# Patient Record
Sex: Female | Born: 1950 | Race: Black or African American | Hispanic: No | Marital: Single | Smoking: Former smoker
Health system: Southern US, Community
[De-identification: ages and names within clinical notes are randomized; demographics above are authoritative.]

## PROBLEM LIST (undated history)

## (undated) DIAGNOSIS — D3502 Benign neoplasm of left adrenal gland: Secondary | ICD-10-CM

## (undated) DIAGNOSIS — F32A Depression, unspecified: Secondary | ICD-10-CM

## (undated) DIAGNOSIS — F329 Major depressive disorder, single episode, unspecified: Secondary | ICD-10-CM

## (undated) DIAGNOSIS — K219 Gastro-esophageal reflux disease without esophagitis: Secondary | ICD-10-CM

## (undated) DIAGNOSIS — M1990 Unspecified osteoarthritis, unspecified site: Secondary | ICD-10-CM

## (undated) DIAGNOSIS — I10 Essential (primary) hypertension: Secondary | ICD-10-CM

## (undated) HISTORY — DX: Unspecified osteoarthritis, unspecified site: M19.90

## (undated) HISTORY — DX: Essential (primary) hypertension: I10

---

## 2010-12-23 ENCOUNTER — Encounter: Payer: Self-pay | Admitting: *Deleted

## 2010-12-23 ENCOUNTER — Emergency Department (INDEPENDENT_AMBULATORY_CARE_PROVIDER_SITE_OTHER): Payer: Self-pay

## 2010-12-23 ENCOUNTER — Emergency Department (HOSPITAL_BASED_OUTPATIENT_CLINIC_OR_DEPARTMENT_OTHER)
Admission: EM | Admit: 2010-12-23 | Discharge: 2010-12-23 | Disposition: A | Discharge status: Home / Self Care | Payer: Self-pay | Attending: Emergency Medicine | Admitting: Emergency Medicine

## 2010-12-23 DIAGNOSIS — M25559 Pain in unspecified hip: Secondary | ICD-10-CM | POA: Insufficient documentation

## 2010-12-23 DIAGNOSIS — Z79899 Other long term (current) drug therapy: Secondary | ICD-10-CM | POA: Insufficient documentation

## 2010-12-23 DIAGNOSIS — F3289 Other specified depressive episodes: Secondary | ICD-10-CM | POA: Insufficient documentation

## 2010-12-23 DIAGNOSIS — F329 Major depressive disorder, single episode, unspecified: Secondary | ICD-10-CM | POA: Insufficient documentation

## 2010-12-23 DIAGNOSIS — K219 Gastro-esophageal reflux disease without esophagitis: Secondary | ICD-10-CM | POA: Insufficient documentation

## 2010-12-23 DIAGNOSIS — M25569 Pain in unspecified knee: Secondary | ICD-10-CM

## 2010-12-23 DIAGNOSIS — I998 Other disorder of circulatory system: Secondary | ICD-10-CM

## 2010-12-23 DIAGNOSIS — M8530 Osteitis condensans, unspecified site: Secondary | ICD-10-CM

## 2010-12-23 HISTORY — DX: Major depressive disorder, single episode, unspecified: F32.9

## 2010-12-23 HISTORY — DX: Depression, unspecified: F32.A

## 2010-12-23 HISTORY — DX: Gastro-esophageal reflux disease without esophagitis: K21.9

## 2010-12-23 MED ORDER — HYDROCODONE-ACETAMINOPHEN 5-325 MG PO TABS: 2.0000 | ORAL_TABLET | ORAL | Status: DC | PRN

## 2010-12-23 MED ORDER — IBUPROFEN 800 MG PO TABS: 800.0000 mg | ORAL_TABLET | Freq: Three times a day (TID) | ORAL | Status: AC

## 2010-12-23 MED ORDER — IBUPROFEN 600 MG PO TABS: 600.0000 mg | ORAL_TABLET | Freq: Four times a day (QID) | ORAL | Status: DC | PRN

## 2010-12-23 NOTE — ED Notes (Signed)
Pt c/o right leg pain and numbness x3 weeks. Pt was seen at [facility] ED apprx. 1.5 weeks ago. Pt has also f/u with her PMD but leg is not improving.

## 2010-12-23 NOTE — ED Provider Notes (Signed)
History     CSN: [id number] Arrival date & time: 12/23/2010  5:46 PM  Chief Complaint  Patient presents with  . Leg Pain    (Consider location/radiation/quality/duration/timing/severity/associated sxs/prior treatment) Patient is a 60 y.o. female presenting with hip pain. The history is provided by the patient. No language interpreter was used.  Hip Pain This is a new problem. The current episode started 1 to 4 weeks ago. The problem occurs constantly. The problem has been rapidly worsening. Associated symptoms include joint swelling and myalgias. The symptoms are aggravated by nothing. She has tried nothing for the symptoms. The treatment provided moderate relief.  Pt complains of pain to right hip,  Pt reports she has had for several weeks.  Pt was seen in Ed and given a shot and steroids.  Pt reports she [facility] and was told that it would take time to feel better.  Past Medical History  Diagnosis Date  . GERD (gastroesophageal reflux disease)   . Depression     History reviewed. No pertinent past surgical history.  No family history on file.  History  Substance Use Topics  . Smoking status: Former Games developer  . Smokeless tobacco: Not on file  . Alcohol Use: No    OB History    Grav Para Term Preterm Abortions TAB SAB Ect Mult Living                  Review of Systems  Musculoskeletal: Positive for myalgias, joint swelling and gait problem.  All other systems reviewed and are negative.    Allergies  Aspirin and Eggs or egg-derived products  Home Medications   Current Outpatient Rx  Name Route Sig Dispense Refill  . ATENOLOL 50 MG PO TABS Oral Take 50 mg by mouth 2 (two) times daily.      Marland Kitchen ACID RELIEF PO Oral Take 1 tablet by mouth daily.      Marland Kitchen GABAPENTIN PO Oral Take 1 capsule by mouth 2 (two) times daily.      Marland Kitchen HYDROCHLOROTHIAZIDE 25 MG PO TABS Oral Take 12.5 mg by mouth daily.      Marland Kitchen MEDROXYPROGESTERONE ACETATE 2.5 MG PO TABS Oral Take 2.5  mg by mouth daily.      Marland Kitchen METHOCARBAMOL PO Oral Take 1 tablet by mouth 3 (three) times daily.      . ROSEMARY OIL OIL Oral Take 1 tablet by mouth 3 (three) times daily as needed. For sleep     . ZOLOFT PO Oral Take 1 tablet by mouth daily.        BP 154/87  Pulse 64  Temp(Src) 98.2 F (36.8 C) (Oral)  Resp 18  SpO2 98%  Physical Exam  Nursing note and vitals reviewed. Constitutional: She is oriented to person, place, and time. She appears well-developed and well-nourished.  HENT:  Head: Normocephalic and atraumatic.  Eyes: Conjunctivae and EOM are normal. Pupils are equal, round, and reactive to light.  Neck: Normal range of motion. Neck supple.  Cardiovascular: Normal rate, regular rhythm and normal heart sounds.   Pulmonary/Chest: Effort normal and breath sounds normal.  Abdominal: Soft.  Musculoskeletal: She exhibits tenderness. She exhibits no edema.       Pain with range of motion,  Hip and knee,  From,  nv and ns intact  Neurological: She is alert and oriented to person, place, and time. She has normal reflexes.  Skin: Skin is warm.  Psychiatric: She has a normal mood and  affect.    ED Course  Procedures (including critical care time)  Labs Reviewed - No data to display Dg Hip Complete Right  12/23/2010  *RADIOLOGY REPORT*  Clinical Data: Hip pain for 3 weeks  RIGHT HIP - COMPLETE 2+ VIEW  Comparison: None  Findings: Minimal osteitis pubis. Symmetric preserved hip and SI joints. No acute fracture, dislocation, or bone destruction. Small acetabular spur questioned. Right pelvic phlebolith.  IMPRESSION: Minimal osteitis pubis. No acute right hip joint abnormalities. Questionable right acetabular spur  Original Report Authenticated By: Lollie Marrow, M.D.   Dg Knee 1-2 Views Right  12/23/2010  *RADIOLOGY REPORT*  Clinical Data: Right knee pain for 3 weeks  RIGHT KNEE - 1-2 VIEW  Comparison: None  Findings: Small patellar spur quadriceps tendon insertion. Osseous  mineralization grossly normal. Joint spaces preserved. No acute fracture, dislocation or bone destruction. No knee joint effusion.  IMPRESSION: No acute bony abnormalities.  Original Report Authenticated By: Lollie Marrow, M.D.     No diagnosis found.    MDM  Xray shows spurs,  I will refer to Dr. Pearletha Forge.  Pt given Rx for ibuprofen and hydrocodone.          Langston Masker, [place] 12/23/10 1929

## 2010-12-25 NOTE — ED Provider Notes (Signed)
Medical screening examination/treatment/procedure(s) were performed by non-physician practitioner and as supervising physician I was immediately available for consultation/collaboration.  Laniqua Torrens, MD 12/25/10 1928 

## 2010-12-30 ENCOUNTER — Other Ambulatory Visit: Payer: Self-pay | Admitting: Family Medicine

## 2010-12-30 ENCOUNTER — Encounter: Payer: Self-pay | Admitting: Family Medicine

## 2010-12-30 ENCOUNTER — Ambulatory Visit (INDEPENDENT_AMBULATORY_CARE_PROVIDER_SITE_OTHER): Payer: Self-pay | Admitting: Family Medicine

## 2010-12-30 VITALS — BP 126/73 | HR 61 | Temp 98.5°F | Ht 63.0 in | Wt 163.0 lb

## 2010-12-30 DIAGNOSIS — M25551 Pain in right hip: Secondary | ICD-10-CM | POA: Insufficient documentation

## 2010-12-30 DIAGNOSIS — M25559 Pain in unspecified hip: Secondary | ICD-10-CM

## 2010-12-30 DIAGNOSIS — M25569 Pain in unspecified knee: Secondary | ICD-10-CM

## 2010-12-30 DIAGNOSIS — M25561 Pain in right knee: Secondary | ICD-10-CM | POA: Insufficient documentation

## 2010-12-30 MED ORDER — OXYCODONE-ACETAMINOPHEN 5-325 MG PO TABS: 1.0000 | ORAL_TABLET | Freq: Four times a day (QID) | ORAL | Status: AC | PRN

## 2010-12-30 NOTE — Assessment & Plan Note (Signed)
patient has signs and symptoms that could indicate intraarticular pathology (DJD with x-rays showing a bone spur) or lumbar radiculopathy (numbness through leg, radiation, some back pain).  Pain was reproduced with hip maneuvers the most however and prednisone did not help as it typically does with radiculopathy.  Will move forward with guided right intraarticular hip cortisone injection for diagnostic and therapeutic reasons.  F/u 2 weeks after this to assess benefit.  If does not help with pain, consider MRI of lumbar spine.

## 2010-12-30 NOTE — Progress Notes (Signed)
Subjective:    Patient ID: Jessica Walker, female    DOB: 12/26/1950, 60 y.o.   MRN: [id number]  PCP: [facility]  HPI 60 yo F here with right hip and knee pain.  Patient reports approximately 1 month of slowly worsening groin, right knee, lateral hip pain. No known injury. Denies prior issues with these areas. Has been seen at [facility] and given IM injection which helped some and started on prednisone which did not help. Pain worsened then went to [facility] ED and placed on muscle relaxant and ibuprofen. Not much help with these either. Reports numbness occasionally that goes down to toes on right side. No bowel/bladder dysfunction. + back pain on right side. She did have a transvaginal ultrasound per her report that showed a ? Small hernia but this was not felt to be the source of her pain.  Past Medical History  Diagnosis Date  . Depression   . Hypertension   . GERD (gastroesophageal reflux disease)     Current Outpatient Prescriptions on File Prior to Visit  Medication Sig Dispense Refill  . atenolol (TENORMIN) 50 MG tablet Take 50 mg by mouth 2 (two) times daily.        . Dihydroxyaluminum Sod Carb (ACID RELIEF PO) Take 1 tablet by mouth daily.        Marland Kitchen GABAPENTIN PO Take 1 capsule by mouth 2 (two) times daily.        . hydrochlorothiazide (HYDRODIURIL) 25 MG tablet Take 12.5 mg by mouth daily.        Marland Kitchen ibuprofen (ADVIL,MOTRIN) 800 MG tablet Take 1 tablet (800 mg total) by mouth 3 (three) times daily.  21 tablet  0  . medroxyPROGESTERone (PROVERA) 2.5 MG tablet Take 2.5 mg by mouth daily.        Marland Kitchen METHOCARBAMOL PO Take 1 tablet by mouth 3 (three) times daily.        Marland Kitchen Rosemary Oil OIL Take 1 tablet by mouth 3 (three) times daily as needed. For sleep       . Sertraline HCl (ZOLOFT PO) Take 1 tablet by mouth daily.          History reviewed. No pertinent past surgical history.  Allergies  Allergen Reactions  . Aspirin     Bothers acid reflux   . Eggs Or Egg-Derived Products Nausea And Vomiting    History   Social History  . Marital Status: Single    Spouse Name: N/A    Number of Children: N/A  . Years of Education: N/A   Occupational History  . Not on file.   Social History Main Topics  . Smoking status: Current Everyday Smoker    Types: Cigarettes  . Smokeless tobacco: Not on file   Comment: 3 cigareets per day  . Alcohol Use: No  . Drug Use: Yes    Special: Marijuana  . Sexually Active: Not on file   Other Topics Concern  . Not on file   Social History Narrative  . No narrative on file    Family History  Problem Relation Age of Onset  . Sudden death Neg Hx   . Heart attack Neg Hx   . Hyperlipidemia Neg Hx   . Hypertension Neg Hx   . Diabetes Neg Hx     BP 126/73  Pulse 61  Temp(Src) 98.5 F (36.9 C) (Oral)  Ht 5\' 3"  (1.6 m)  Wt 163 lb (73.936 kg)  BMI 28.87 kg/m2  Review of  Systems See HPI above.    Objective:   Physical Exam Gen: NAD  Back: No gross deformity, scoliosis. TTP right lumbar paraspinal region.  No midline or bony TTP. FROM with mild right sided pain. Strength LEs 4/5 bilateral hip flexors, right knee extension, 5/5 all other bilateral muscle groups.   3+ MSRs in patellar and achilles tendons, equal bilaterally. Negative SLRs. Sensation diminished throughout right lower extremity compared to left.  R hip: No gross deformity. TTP anteriorly over hip joint in groin.  Mild TTP greater trochanter. FROM but pain with internal and external rotation within groin. + logroll Fabers with + groin pain reproducing her pain.  R knee: No gross deformity, ecchymoses, swelling.  Medial > lateral joint line TTP. FROM with pain on full flexion. Negative ant/post drawers. Negative valgus/varus testing. Negative lachmanns. Negative mcmurrays, apleys, patellar apprehension, clarkes. NV intact distally.    Assessment & Plan:  1. Right hip pain - patient has signs and symptoms that  could indicate intraarticular pathology (DJD with x-rays showing a bone spur) or lumbar radiculopathy (numbness through leg, radiation, some back pain).  Pain was reproduced with hip maneuvers the most however and prednisone did not help as it typically does with radiculopathy.  Will move forward with guided right intraarticular hip cortisone injection for diagnostic and therapeutic reasons.  F/u 2 weeks after this to assess benefit.  If does not help with pain, consider MRI of lumbar spine.  2. Right knee pain - joint line tenderness with minimal DJD but radiographs not done standing.  Try intraarticular injection today in addition to hip injection.  If not improving, will work up with MRI of lumbar spine for radiculopathy.  After informed written consent, patient was seated on exam table. Right knee was prepped with alcohol swab and utilizing anterolateral approach, patient's right knee was injected intraarticularly with 3:1 marcaine: depomedrol. Patient tolerated the procedure well without immediate complications.

## 2010-12-30 NOTE — Assessment & Plan Note (Signed)
Joint line tenderness with minimal DJD but radiographs not done standing.  Try intraarticular injection today in addition to hip injection.  If not improving, will work up with MRI of lumbar spine for radiculopathy.  After informed written consent, patient was seated on exam table. Right knee was prepped with alcohol swab and utilizing anterolateral approach, patient's right knee was injected intraarticularly with 3:1 marcaine: depomedrol. Patient tolerated the procedure well without immediate complications.

## 2010-12-30 NOTE — Patient Instructions (Signed)
Take oxycodone as needed for pain. The injection in your knee should help as well. We will schedule you for an injection of your hip - this will give [place] good information - if it works it's definitely coming from your hip and your pain will be better;  If it doesn't, we'll go ahead with x-rays and an mri of your back to look for a pinched nerve. Heat or ice as needed 15 minutes at a time. Take ibuprofen as you have been as needed. Follow up with me 2 weeks after your hip injection to recheck how you're doing.

## 2011-01-04 ENCOUNTER — Ambulatory Visit
Admission: RE | Admit: 2011-01-04 | Discharge: 2011-01-04 | Disposition: A | Discharge status: Home / Self Care | Payer: No Typology Code available for payment source | Source: Ambulatory Visit | Attending: Family Medicine | Admitting: Family Medicine

## 2011-01-04 DIAGNOSIS — M25551 Pain in right hip: Secondary | ICD-10-CM

## 2011-01-04 MED ORDER — IOHEXOL 180 MG/ML  SOLN
1.0000 mL | Freq: Once | INTRAMUSCULAR | Status: AC | PRN
  Administered 2011-01-04: 1 mL via INTRA_ARTICULAR

## 2011-01-04 MED ORDER — METHYLPREDNISOLONE ACETATE 40 MG/ML INJ SUSP (RADIOLOG
120.0000 mg | Freq: Once | INTRAMUSCULAR | Status: AC
  Administered 2011-01-04: 120 mg via INTRA_ARTICULAR

## 2012-10-18 ENCOUNTER — Other Ambulatory Visit (HOSPITAL_COMMUNITY): Payer: Self-pay | Admitting: *Deleted

## 2012-10-18 DIAGNOSIS — M2550 Pain in unspecified joint: Secondary | ICD-10-CM

## 2012-10-29 ENCOUNTER — Other Ambulatory Visit (HOSPITAL_COMMUNITY): Payer: Self-pay | Admitting: Family Medicine

## 2012-10-29 DIAGNOSIS — M2550 Pain in unspecified joint: Secondary | ICD-10-CM

## 2012-10-30 ENCOUNTER — Ambulatory Visit (HOSPITAL_COMMUNITY)
Admission: RE | Admit: 2012-10-30 | Discharge: 2012-10-30 | Disposition: A | Discharge status: Home / Self Care | Payer: No Typology Code available for payment source | Source: Ambulatory Visit | Attending: Internal Medicine | Admitting: Internal Medicine

## 2012-10-30 DIAGNOSIS — M2550 Pain in unspecified joint: Secondary | ICD-10-CM

## 2012-10-30 DIAGNOSIS — Z13820 Encounter for screening for osteoporosis: Secondary | ICD-10-CM | POA: Insufficient documentation

## 2012-10-30 DIAGNOSIS — Z780 Asymptomatic menopausal state: Secondary | ICD-10-CM | POA: Insufficient documentation

## 2013-02-07 ENCOUNTER — Encounter (HOSPITAL_BASED_OUTPATIENT_CLINIC_OR_DEPARTMENT_OTHER): Payer: Self-pay | Admitting: Emergency Medicine

## 2013-02-07 ENCOUNTER — Emergency Department (HOSPITAL_BASED_OUTPATIENT_CLINIC_OR_DEPARTMENT_OTHER): Payer: No Typology Code available for payment source

## 2013-02-07 ENCOUNTER — Inpatient Hospital Stay (HOSPITAL_BASED_OUTPATIENT_CLINIC_OR_DEPARTMENT_OTHER)
Admission: EM | Admit: 2013-02-07 | Discharge: 2013-02-10 | DRG: 419 | Disposition: A | Discharge status: Home / Self Care | Payer: No Typology Code available for payment source | Attending: Surgery | Admitting: Surgery

## 2013-02-07 DIAGNOSIS — D3500 Benign neoplasm of unspecified adrenal gland: Secondary | ICD-10-CM | POA: Diagnosis present

## 2013-02-07 DIAGNOSIS — I10 Essential (primary) hypertension: Secondary | ICD-10-CM | POA: Diagnosis present

## 2013-02-07 DIAGNOSIS — F329 Major depressive disorder, single episode, unspecified: Secondary | ICD-10-CM | POA: Diagnosis present

## 2013-02-07 DIAGNOSIS — Z91012 Allergy to eggs: Secondary | ICD-10-CM

## 2013-02-07 DIAGNOSIS — Z886 Allergy status to analgesic agent status: Secondary | ICD-10-CM

## 2013-02-07 DIAGNOSIS — K811 Chronic cholecystitis: Secondary | ICD-10-CM

## 2013-02-07 DIAGNOSIS — F3289 Other specified depressive episodes: Secondary | ICD-10-CM | POA: Diagnosis present

## 2013-02-07 DIAGNOSIS — K8000 Calculus of gallbladder with acute cholecystitis without obstruction: Principal | ICD-10-CM | POA: Diagnosis present

## 2013-02-07 DIAGNOSIS — F17200 Nicotine dependence, unspecified, uncomplicated: Secondary | ICD-10-CM | POA: Diagnosis present

## 2013-02-07 DIAGNOSIS — K810 Acute cholecystitis: Secondary | ICD-10-CM

## 2013-02-07 DIAGNOSIS — K219 Gastro-esophageal reflux disease without esophagitis: Secondary | ICD-10-CM | POA: Diagnosis present

## 2013-02-07 HISTORY — DX: Benign neoplasm of left adrenal gland: D35.02

## 2013-02-07 LAB — LIPASE, BLOOD: Lipase: 8 U/L — ABNORMAL LOW (ref 11–59)

## 2013-02-07 LAB — URINALYSIS, ROUTINE W REFLEX MICROSCOPIC
Bilirubin Urine: NEGATIVE
Hgb urine dipstick: NEGATIVE
Leukocytes, UA: NEGATIVE
Specific Gravity, Urine: 1.022 (ref 1.005–1.030)
Urobilinogen, UA: 1 mg/dL (ref 0.0–1.0)
pH: 6.5 (ref 5.0–8.0)

## 2013-02-07 LAB — HEPATIC FUNCTION PANEL
ALT: 15 U/L (ref 0–35)
AST: 17 U/L (ref 0–37)
Albumin: 3.4 g/dL — ABNORMAL LOW (ref 3.5–5.2)
Alkaline Phosphatase: 79 U/L (ref 39–117)
Total Bilirubin: 0.6 mg/dL (ref 0.3–1.2)

## 2013-02-07 LAB — BASIC METABOLIC PANEL
CO2: 28 mEq/L (ref 19–32)
Calcium: 9.3 mg/dL (ref 8.4–10.5)
Chloride: 97 mEq/L (ref 96–112)
GFR calc Af Amer: >90 mL/min (ref >90)
Glucose, Bld: 94 mg/dL (ref 70–99)
Potassium: 2.7 mEq/L — CL (ref 3.5–5.1)
Sodium: 137 mEq/L (ref 135–145)

## 2013-02-07 LAB — CBC
Hemoglobin: 12.6 g/dL (ref 12.0–15.0)
MCH: 27.6 pg (ref 26.0–34.0)
MCV: 83.1 fL (ref 78.0–100.0)
RBC: 4.56 MIL/uL (ref 3.87–5.11)

## 2013-02-07 MED ORDER — ACETAMINOPHEN 325 MG PO TABS: 650.0000 mg | ORAL_TABLET | Freq: Four times a day (QID) | ORAL | Status: DC | PRN

## 2013-02-07 MED ORDER — SODIUM CHLORIDE 0.9 % IV SOLN: 3.0000 g | Freq: Four times a day (QID) | INTRAVENOUS | Status: DC

## 2013-02-07 MED ORDER — GI COCKTAIL ~~LOC~~
30.0000 mL | Freq: Once | ORAL | Status: AC
  Administered 2013-02-07: 30 mL via ORAL
  Filled 2013-02-07: qty 30

## 2013-02-07 MED ORDER — KCL IN DEXTROSE-NACL 30-5-0.45 MEQ/L-%-% IV SOLN
INTRAVENOUS | Status: DC
  Administered 2013-02-07: 100 mL/h via INTRAVENOUS
  Administered 2013-02-08 – 2013-02-09 (×3): via INTRAVENOUS
  Filled 2013-02-07 (×6): qty 1000

## 2013-02-07 MED ORDER — HYDROMORPHONE HCL PF 1 MG/ML IJ SOLN
1.0000 mg | INTRAMUSCULAR | Status: DC | PRN
  Administered 2013-02-07 – 2013-02-10 (×7): 1 mg via INTRAVENOUS
  Filled 2013-02-07 (×7): qty 1

## 2013-02-07 MED ORDER — METOPROLOL TARTRATE 1 MG/ML IV SOLN
5.0000 mg | Freq: Four times a day (QID) | INTRAVENOUS | Status: DC
  Filled 2013-02-07 (×6): qty 5

## 2013-02-07 MED ORDER — HYDROCODONE-ACETAMINOPHEN 5-325 MG PO TABS
1.0000 | ORAL_TABLET | ORAL | Status: DC | PRN
  Administered 2013-02-08 – 2013-02-09 (×2): 2 via ORAL
  Filled 2013-02-07 (×2): qty 2

## 2013-02-07 MED ORDER — IOHEXOL 300 MG/ML  SOLN
50.0000 mL | Freq: Once | INTRAMUSCULAR | Status: AC | PRN
  Administered 2013-02-07: 50 mL via ORAL

## 2013-02-07 MED ORDER — ONDANSETRON HCL 4 MG/2ML IJ SOLN: 4.0000 mg | Freq: Four times a day (QID) | INTRAMUSCULAR | Status: DC | PRN

## 2013-02-07 MED ORDER — ONDANSETRON HCL 4 MG/2ML IJ SOLN
4.0000 mg | Freq: Once | INTRAMUSCULAR | Status: AC
  Administered 2013-02-07: 4 mg via INTRAVENOUS
  Filled 2013-02-07: qty 2

## 2013-02-07 MED ORDER — ACETAMINOPHEN 650 MG RE SUPP: 650.0000 mg | Freq: Four times a day (QID) | RECTAL | Status: DC | PRN

## 2013-02-07 MED ORDER — MORPHINE SULFATE 4 MG/ML IJ SOLN
4.0000 mg | Freq: Once | INTRAMUSCULAR | Status: AC
  Administered 2013-02-07: 4 mg via INTRAVENOUS
  Filled 2013-02-07: qty 1

## 2013-02-07 MED ORDER — IOHEXOL 300 MG/ML  SOLN
100.0000 mL | Freq: Once | INTRAMUSCULAR | Status: AC | PRN
  Administered 2013-02-07: 100 mL via INTRAVENOUS

## 2013-02-07 MED ORDER — HYDROMORPHONE HCL PF 1 MG/ML IJ SOLN
1.0000 mg | Freq: Once | INTRAMUSCULAR | Status: AC
  Administered 2013-02-07: 1 mg via INTRAVENOUS
  Filled 2013-02-07: qty 1

## 2013-02-07 NOTE — ED Notes (Signed)
Spoke with Aurther Loft, Consulting civil engineer at [facility] about about patient being transferred to [facility]

## 2013-02-07 NOTE — H&P (Signed)
Jessica Walker is an 62 y.o. female.    General Surgery [facility]  Chief Complaint: acute cholecystitis, abdominal pain  HPI: the patient is a 62 year old female transferred from [facility] to the [facility] long emergency department for assessment by general surgery. The patient had presented with a three-day history of abdominal pain localized to the upper abdomen. Evaluation in the emergency department showed an elevated white blood cell count of 16,000. CT scan of the abdomen and pelvis was obtained which showed findings consistent with acute cholecystitis. Patient was transferred for general surgical care.  Patient has had a previous cesarean section. She has a history of gastroesophageal reflux and has undergone dilatations in the past. She denies any previous hepatobiliary or pancreatic disease. She denies acholic stools. She denies jaundice. She denies fevers or chills.  Past Medical History  Diagnosis Date  . Depression   . Hypertension   . GERD (gastroesophageal reflux disease)   . Adenoma of left adrenal gland     History reviewed. No pertinent past surgical history.  Family History  Problem Relation Age of Onset  . Sudden death Neg Hx   . Heart attack Neg Hx   . Hyperlipidemia Neg Hx   . Hypertension Neg Hx   . Diabetes Neg Hx    Social History:  reports that she has been smoking Cigarettes.  She has been smoking about 0.00 packs per day. She does not have any smokeless tobacco history on file. She reports that she uses illicit drugs (Marijuana). She reports that she does not drink alcohol.  Allergies:  Allergies  Allergen Reactions  . Aspirin     Bothers acid reflux  . Eggs Or Egg-Derived Products Nausea And Vomiting     (Not in a hospital admission)  Results for orders placed during the hospital encounter of 02/07/13 (from the past 48 hour(s))  URINALYSIS, ROUTINE W REFLEX MICROSCOPIC     Status: Abnormal   Collection Time   02/07/13  3:00 PM      Result Value Range   Color, Urine AMBER (*) YELLOW   Comment: BIOCHEMICALS MAY BE AFFECTED BY COLOR   APPearance CLOUDY (*) CLEAR   Specific Gravity, Urine 1.022  1.005 - 1.030   pH 6.5  5.0 - 8.0   Glucose, UA NEGATIVE  NEGATIVE mg/dL   Hgb urine dipstick NEGATIVE  NEGATIVE   Bilirubin Urine NEGATIVE  NEGATIVE   Ketones, ur NEGATIVE  NEGATIVE mg/dL   Protein, ur NEGATIVE  NEGATIVE mg/dL   Urobilinogen, UA 1.0  0.0 - 1.0 mg/dL   Nitrite NEGATIVE  NEGATIVE   Leukocytes, UA NEGATIVE  NEGATIVE   Comment: MICROSCOPIC NOT DONE ON URINES WITH NEGATIVE PROTEIN, BLOOD, LEUKOCYTES, NITRITE, OR GLUCOSE <1000 mg/dL.  CBC     Status: Abnormal   Collection Time    02/07/13  4:00 PM      Result Value Range   WBC 16.0 (*) 4.0 - 10.5 K/uL   RBC 4.56  3.87 - 5.11 MIL/uL   Hemoglobin 12.6  12.0 - 15.0 g/dL   HCT 14.7  82.9 - 56.2 %   MCV 83.1  78.0 - 100.0 fL   MCH 27.6  26.0 - 34.0 pg   MCHC 33.2  30.0 - 36.0 g/dL   RDW 13.0  86.5 - 78.4 %   Platelets 281  150 - 400 K/uL  BASIC METABOLIC PANEL     Status: Abnormal   Collection Time    02/07/13  4:00 PM      Result Value Range   Sodium 137  135 - 145 mEq/L   Potassium 2.7 (*) 3.5 - 5.1 mEq/L   Comment: CRITICAL RESULT CALLED TO, READ BACK BY AND VERIFIED WITH:     KAYLA CHUCHILL RN @1652  02/07/13 OLSONM   Chloride 97  96 - 112 mEq/L   CO2 28  19 - 32 mEq/L   Glucose, Bld 94  70 - 99 mg/dL   BUN 13  6 - 23 mg/dL   Creatinine, Ser 1.61  0.50 - 1.10 mg/dL   Calcium 9.3  8.4 - 09.6 mg/dL   GFR calc non Af Amer >90  >90 mL/min   GFR calc Af Amer >90  >90 mL/min   Comment: (NOTE)     The eGFR has been calculated using the CKD EPI equation.     This calculation has not been validated in all clinical situations.     eGFR's persistently <90 mL/min signify possible Chronic Kidney     Disease.  HEPATIC FUNCTION PANEL     Status: Abnormal   Collection Time    02/07/13  4:00 PM      Result Value Range   Total Protein 7.7   6.0 - 8.3 g/dL   Albumin 3.4 (*) 3.5 - 5.2 g/dL   AST 17  0 - 37 U/L   ALT 15  0 - 35 U/L   Alkaline Phosphatase 79  39 - 117 U/L   Total Bilirubin 0.6  0.3 - 1.2 mg/dL   Bilirubin, Direct <0.4  0.0 - 0.3 mg/dL   Indirect Bilirubin NOT CALCULATED  0.3 - 0.9 mg/dL  LIPASE, BLOOD     Status: Abnormal   Collection Time    02/07/13  4:00 PM      Result Value Range   Lipase 8 (*) 11 - 59 U/L   Ct Abdomen Pelvis W Contrast  02/07/2013   CLINICAL DATA:  Abdominal pain  EXAM: CT ABDOMEN AND PELVIS WITH CONTRAST  TECHNIQUE: Multidetector CT imaging of the abdomen and pelvis was performed using the standard protocol following bolus administration of intravenous contrast.  CONTRAST:  50mL OMNIPAQUE IOHEXOL 300 MG/ML SOLN, OMNIPAQUE IOHEXOL 300 MG/ML SOLN  COMPARISON:  08/15/2012  FINDINGS: There is severe wall thickening of the gallbladder. There is inflammatory changes in the adjacent fat. No definite stones can be visualized.  Stable tiny hypodensity in the right lobe of the liver.  Stable left adrenal adenoma.  Right adrenal gland is unremarkable.  Pancreas is somewhat atrophic.  Kidneys are unremarkable.  Bladder, uterus, and adnexa are unremarkable.  Normal appendix.  IMPRESSION: There is severe wall thickening of the gallbladder associated with inflammatory change in the adjacent fat. Findings are worrisome for acute cholecystitis. Ultrasound may be helpful to further characterize.   Electronically Signed   By: Maryclare Bean M.D.   On: 02/07/2013 18:11    Review of Systems  Constitutional: Positive for diaphoresis. Negative for fever, chills, weight loss and malaise/fatigue.  HENT: Negative.   Eyes: Negative.   Respiratory: Negative.   Cardiovascular: Negative.   Gastrointestinal: Positive for heartburn and abdominal pain (diffuse upper abdominal pain). Negative for nausea, vomiting, diarrhea and constipation.  Genitourinary: Negative.   Musculoskeletal: Positive for back pain.  Skin:  Negative.   Neurological: Negative.  Negative for weakness.  Endo/Heme/Allergies: Negative.   Psychiatric/Behavioral: Negative.     Blood pressure 123/63, pulse 78, temperature 99.3 F (37.4 C), temperature source Oral, resp.  rate 18, height 5\' 3"  (1.6 m), weight 154 lb (69.854 kg), SpO2 95.00%. Physical Exam  Constitutional: She is oriented to person, place, and time. She appears well-developed and well-nourished. No distress.  HENT:  Head: Normocephalic and atraumatic.  Right Ear: External ear normal.  Left Ear: External ear normal.  Mouth/Throat: No oropharyngeal exudate.  Eyes: Conjunctivae are normal. Pupils are equal, round, and reactive to light. No scleral icterus.  Neck: Normal range of motion. Neck supple. No tracheal deviation present. No thyromegaly present.  Cardiovascular: Normal rate, regular rhythm and normal heart sounds.   No murmur heard. Respiratory: Effort normal and breath sounds normal. She has no wheezes. She has no rales.  GI: Soft. Bowel sounds are normal. She exhibits no distension and no mass. There is tenderness (epigastrium and RUQ). There is no rebound and no guarding.  Musculoskeletal: Normal range of motion. She exhibits no edema and no tenderness.  Lymphadenopathy:    She has no cervical adenopathy.  Neurological: She is alert and oriented to person, place, and time.  Skin: Skin is warm and dry.  Psychiatric: She has a normal mood and affect. Her behavior is normal.     Assessment/Plan Acute cholecystitis  Admit to general surgery service  Begin IV abx  NPO after MN  Anticipate cholecystectomy in AM 11/29  I discussed laparoscopic cholecystectomy with the patient and her family at length. I explained the procedure. I explained the distinct possibility that she might be converted to open surgery in the event of extensive inflammation or infection. We discussed the hospital stay to be anticipated. They understand and agree to proceed.  The risks  and benefits of the procedure have been discussed at length with the patient.  The patient understands the proposed procedure, potential alternative treatments, and the course of recovery to be expected.  All of the patient's questions have been answered at this time.  The patient wishes to proceed with surgery.  Velora Heckler, MD, [facility] Office: [phone]    Ercelle Winkles M 02/07/2013, 9:47 PM

## 2013-02-07 NOTE — ED Notes (Signed)
Bed: WA08 Expected date:  Expected time:  Means of arrival:  Comments: Transfer from [facility]-gallbladder-call Dr. Gerrit Friends when pt arrives

## 2013-02-07 NOTE — ED Provider Notes (Signed)
Transferred from the [facility] to see general surgery for her cholecystitis.  Jessica Walker. Rubin Payor, MD 02/07/13 2127

## 2013-02-07 NOTE — ED Notes (Signed)
C/o abd pain x 3 days-denies n/v/d

## 2013-02-07 NOTE — ED Notes (Signed)
Received pt. From [facility] per [facility] ,alert and oriented, denied pain at this time, received pain med prior to transfer. Kept NPO.

## 2013-02-07 NOTE — ED Provider Notes (Signed)
CSN: [id number]     Arrival date & time 02/07/13  1455 History   First MD Initiated Contact with Patient 02/07/13 1517     Chief Complaint  Patient presents with  . Abdominal Pain   (Consider location/radiation/quality/duration/timing/severity/associated sxs/prior Treatment) Patient is a 62 y.o. female presenting with abdominal pain. The history is provided by the patient.  Abdominal Pain Pain location:  Epigastric Pain quality: aching and burning   Pain radiates to:  Back Pain severity:  Moderate Onset quality:  Gradual Duration:  3 days Timing:  Constant Progression:  Worsening Chronicity:  New Context comment:  Recently used NSAIDs for past month Relieved by:  Nothing Exacerbated by: bending over. Associated symptoms: nausea   Associated symptoms: no cough, no diarrhea, no fever, no shortness of breath and no vomiting     Past Medical History  Diagnosis Date  . Depression   . Hypertension   . GERD (gastroesophageal reflux disease)    History reviewed. No pertinent past surgical history. Family History  Problem Relation Age of Onset  . Sudden death Neg Hx   . Heart attack Neg Hx   . Hyperlipidemia Neg Hx   . Hypertension Neg Hx   . Diabetes Neg Hx    History  Substance Use Topics  . Smoking status: Current Some Day Smoker    Types: Cigarettes  . Smokeless tobacco: Not on file     Comment: 3 cigareets per day  . Alcohol Use: No   OB History   Grav Para Term Preterm Abortions TAB SAB Ect Mult Living                 Review of Systems  Constitutional: Negative for fever.  Respiratory: Negative for cough and shortness of breath.   Gastrointestinal: Positive for nausea, abdominal pain and blood in stool (yesterday, on stool). Negative for vomiting and diarrhea.  Neurological: Negative for dizziness.  All other systems reviewed and are negative.    Allergies  Aspirin and Eggs or egg-derived products  Home Medications   Current Outpatient Rx  Name   Route  Sig  Dispense  Refill  . atenolol (TENORMIN) 50 MG tablet   Oral   Take 50 mg by mouth 2 (two) times daily.           . Dihydroxyaluminum Sod Carb (ACID RELIEF PO)   Oral   Take 1 tablet by mouth daily.           Marland Kitchen GABAPENTIN PO   Oral   Take 1 capsule by mouth 2 (two) times daily.           . hydrochlorothiazide (HYDRODIURIL) 25 MG tablet   Oral   Take 12.5 mg by mouth daily.           . medroxyPROGESTERone (PROVERA) 2.5 MG tablet   Oral   Take 2.5 mg by mouth daily.           Marland Kitchen METHOCARBAMOL PO   Oral   Take 1 tablet by mouth 3 (three) times daily.           Marland Kitchen Rosemary Oil OIL   Oral   Take 1 tablet by mouth 3 (three) times daily as needed. For sleep          . Sertraline HCl (ZOLOFT PO)   Oral   Take 1 tablet by mouth daily.            BP 122/69  Pulse 80  Temp(Src) 97.8 F (36.6  C) (Oral)  Resp 18  Ht 5\' 3"  (1.6 m)  Wt 154 lb (69.854 kg)  BMI 27.29 kg/m2  SpO2 97% Physical Exam  Nursing note and vitals reviewed. Constitutional: She is oriented to person, place, and time. She appears well-developed and well-nourished. No distress.  HENT:  Head: Normocephalic and atraumatic.  Eyes: EOM are normal. Pupils are equal, round, and reactive to light.  Neck: Normal range of motion. Neck supple.  Cardiovascular: Normal rate and regular rhythm.  Exam reveals no friction rub.   No murmur heard. Pulmonary/Chest: Effort normal and breath sounds normal. No respiratory distress. She has no wheezes. She has no rales.  Abdominal: Soft. She exhibits no distension. There is tenderness (upper abdomen - moderate, mild lower abdomen). There is no rebound.  Musculoskeletal: Normal range of motion. She exhibits no edema.  R hand with positive Phalen's sign  Neurological: She is alert and oriented to person, place, and time.  Skin: No rash noted. She is not diaphoretic.    ED Course  Procedures (including critical care time) Labs Review Labs Reviewed   URINALYSIS, ROUTINE W REFLEX MICROSCOPIC - Abnormal; Notable for the following:    Color, Urine AMBER (*)    APPearance CLOUDY (*)    All other components within normal limits  CBC - Abnormal; Notable for the following:    WBC 16.0 (*)    All other components within normal limits  BASIC METABOLIC PANEL - Abnormal; Notable for the following:    Potassium 2.7 (*)    All other components within normal limits  HEPATIC FUNCTION PANEL - Abnormal; Notable for the following:    Albumin 3.4 (*)    All other components within normal limits  LIPASE, BLOOD - Abnormal; Notable for the following:    Lipase 8 (*)    All other components within normal limits   Imaging Review No results found.  EKG Interpretation    Date/Time:    Ventricular Rate:    PR Interval:    QRS Duration:   QT Interval:    QTC Calculation:   R Axis:     Text Interpretation:              MDM   1. Cholecystitis, acute    25F presents with upper abdominal pain. She's had a burning/raw feeling for the past 3 days her upper abdomen and radiates up into her chest. She denies any other chest pain or shortness of breath. She had some nausea without any vomiting. It's worsened she bends over. She denies any diarrhea. She's had no fever, urinary symptoms, vaginal symptoms. She had normal bowel movements. She's never had this before. Chest was taken 500 mg of naproxen twice a day for the past month for right hand pain. Here vitals are stable. She has diffuse upper abdominal pain and in both left and right upper quadrants. She has mild lower bowel pain. No rebound or guarding. I believe patient's symptoms are secondary to gastritis due to her naproxen use, however she is a smoker and has diffuse abdominal pain, so we will check labs and check a CT of her abdomen and pelvis. GI cocktail and Zofran given. Patient CT scan showed acute cholecystitis. Patient transferred to was a long period Dr. Georgana Curio with surgery consult  appear  She's also having intermittent R hand numbness for a long time. 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 Velcro cock-up splint given.    Dagmar Hait, MD 02/08/13  0011 

## 2013-02-08 ENCOUNTER — Encounter (HOSPITAL_COMMUNITY): Payer: No Typology Code available for payment source | Admitting: Registered Nurse

## 2013-02-08 ENCOUNTER — Encounter (HOSPITAL_COMMUNITY): Admission: EM | Disposition: A | Discharge status: Home / Self Care | Payer: Self-pay | Source: Home / Self Care

## 2013-02-08 ENCOUNTER — Observation Stay (HOSPITAL_COMMUNITY): Payer: No Typology Code available for payment source

## 2013-02-08 ENCOUNTER — Observation Stay (HOSPITAL_COMMUNITY): Payer: No Typology Code available for payment source | Admitting: Registered Nurse

## 2013-02-08 ENCOUNTER — Encounter (HOSPITAL_COMMUNITY): Payer: Self-pay

## 2013-02-08 ENCOUNTER — Inpatient Hospital Stay: Admit: 2013-02-08 | Payer: Self-pay | Admitting: Surgery

## 2013-02-08 DIAGNOSIS — K8000 Calculus of gallbladder with acute cholecystitis without obstruction: Secondary | ICD-10-CM | POA: Diagnosis present

## 2013-02-08 HISTORY — PX: CHOLECYSTECTOMY: SHX55

## 2013-02-08 LAB — SURGICAL PCR SCREEN: MRSA, PCR: NEGATIVE

## 2013-02-08 LAB — POTASSIUM: Potassium: 2.9 mEq/L — ABNORMAL LOW (ref 3.5–5.1)

## 2013-02-08 SURGERY — LAPAROSCOPIC CHOLECYSTECTOMY WITH INTRAOPERATIVE CHOLANGIOGRAM
Anesthesia: General | Site: Abdomen | Wound class: Contaminated

## 2013-02-08 MED ORDER — METOPROLOL TARTRATE 1 MG/ML IV SOLN
5.0000 mg | Freq: Four times a day (QID) | INTRAVENOUS | Status: DC | PRN
  Filled 2013-02-08: qty 5

## 2013-02-08 MED ORDER — GLYCOPYRROLATE 0.2 MG/ML IJ SOLN
INTRAMUSCULAR | Status: DC | PRN
  Administered 2013-02-08: 0.6 mg via INTRAVENOUS

## 2013-02-08 MED ORDER — PROPOFOL 10 MG/ML IV BOLUS
INTRAVENOUS | Status: DC | PRN
  Administered 2013-02-08: 200 mg via INTRAVENOUS

## 2013-02-08 MED ORDER — ATENOLOL 50 MG PO TABS
50.0000 mg | ORAL_TABLET | Freq: Two times a day (BID) | ORAL | Status: DC
  Administered 2013-02-08 – 2013-02-10 (×4): 50 mg via ORAL
  Filled 2013-02-08 (×5): qty 1

## 2013-02-08 MED ORDER — SERTRALINE HCL 25 MG PO TABS
25.0000 mg | ORAL_TABLET | Freq: Every day | ORAL | Status: DC
  Administered 2013-02-08 – 2013-02-10 (×3): 25 mg via ORAL
  Filled 2013-02-08 (×3): qty 1

## 2013-02-08 MED ORDER — BUPIVACAINE-EPINEPHRINE 0.25% -1:200000 IJ SOLN
INTRAMUSCULAR | Status: AC
  Filled 2013-02-08: qty 1

## 2013-02-08 MED ORDER — ONDANSETRON HCL 4 MG/2ML IJ SOLN
INTRAMUSCULAR | Status: DC | PRN
  Administered 2013-02-08: 4 mg via INTRAVENOUS

## 2013-02-08 MED ORDER — NEOSTIGMINE METHYLSULFATE 1 MG/ML IJ SOLN
INTRAMUSCULAR | Status: DC | PRN
  Administered 2013-02-08: 4 mg via INTRAVENOUS

## 2013-02-08 MED ORDER — SUCCINYLCHOLINE CHLORIDE 20 MG/ML IJ SOLN
INTRAMUSCULAR | Status: DC | PRN
  Administered 2013-02-08: 100 mg via INTRAVENOUS

## 2013-02-08 MED ORDER — MIDAZOLAM HCL 2 MG/2ML IJ SOLN
INTRAMUSCULAR | Status: AC
  Filled 2013-02-08: qty 2

## 2013-02-08 MED ORDER — SODIUM CHLORIDE 0.9 % IV SOLN
INTRAVENOUS | Status: AC
  Filled 2013-02-08: qty 3

## 2013-02-08 MED ORDER — SUFENTANIL CITRATE 50 MCG/ML IV SOLN
INTRAVENOUS | Status: AC
  Filled 2013-02-08: qty 1

## 2013-02-08 MED ORDER — MIDAZOLAM HCL 5 MG/5ML IJ SOLN
INTRAMUSCULAR | Status: DC | PRN
  Administered 2013-02-08: 1 mg via INTRAVENOUS

## 2013-02-08 MED ORDER — HYDROMORPHONE HCL PF 1 MG/ML IJ SOLN
INTRAMUSCULAR | Status: AC
  Filled 2013-02-08: qty 1

## 2013-02-08 MED ORDER — IOHEXOL 300 MG/ML  SOLN
INTRAMUSCULAR | Status: DC | PRN
  Administered 2013-02-08: 6 mL via INTRAVENOUS

## 2013-02-08 MED ORDER — ROCURONIUM BROMIDE 100 MG/10ML IV SOLN
INTRAVENOUS | Status: AC
  Filled 2013-02-08: qty 1

## 2013-02-08 MED ORDER — PHENYLEPHRINE HCL 10 MG/ML IJ SOLN
INTRAMUSCULAR | Status: DC | PRN
  Administered 2013-02-08: 80 ug via INTRAVENOUS

## 2013-02-08 MED ORDER — LABETALOL HCL 5 MG/ML IV SOLN
INTRAVENOUS | Status: AC
  Filled 2013-02-08: qty 4

## 2013-02-08 MED ORDER — STERILE WATER FOR INJECTION IJ SOLN
INTRAMUSCULAR | Status: AC
  Filled 2013-02-08: qty 10

## 2013-02-08 MED ORDER — ONDANSETRON HCL 4 MG/2ML IJ SOLN
INTRAMUSCULAR | Status: AC
  Filled 2013-02-08: qty 2

## 2013-02-08 MED ORDER — SUCCINYLCHOLINE CHLORIDE 20 MG/ML IJ SOLN
INTRAMUSCULAR | Status: AC
  Filled 2013-02-08: qty 1

## 2013-02-08 MED ORDER — DEXAMETHASONE SODIUM PHOSPHATE 10 MG/ML IJ SOLN
INTRAMUSCULAR | Status: AC
  Filled 2013-02-08: qty 1

## 2013-02-08 MED ORDER — BUPIVACAINE-EPINEPHRINE 0.25% -1:200000 IJ SOLN
INTRAMUSCULAR | Status: DC | PRN
  Administered 2013-02-08: 22 mL

## 2013-02-08 MED ORDER — SODIUM CHLORIDE 0.9 % IV SOLN
3.0000 g | Freq: Four times a day (QID) | INTRAVENOUS | Status: DC
  Administered 2013-02-08 – 2013-02-10 (×10): 3 g via INTRAVENOUS
  Filled 2013-02-08 (×13): qty 3

## 2013-02-08 MED ORDER — LACTATED RINGERS IV SOLN
INTRAVENOUS | Status: DC | PRN
  Administered 2013-02-08 (×2): via INTRAVENOUS

## 2013-02-08 MED ORDER — PROPOFOL 10 MG/ML IV BOLUS
INTRAVENOUS | Status: AC
  Filled 2013-02-08: qty 20

## 2013-02-08 MED ORDER — GLYCOPYRROLATE 0.2 MG/ML IJ SOLN
INTRAMUSCULAR | Status: AC
  Filled 2013-02-08: qty 3

## 2013-02-08 MED ORDER — DEXAMETHASONE SODIUM PHOSPHATE 10 MG/ML IJ SOLN
INTRAMUSCULAR | Status: DC | PRN
  Administered 2013-02-08: 10 mg via INTRAVENOUS

## 2013-02-08 MED ORDER — LIDOCAINE HCL (CARDIAC) 20 MG/ML IV SOLN
INTRAVENOUS | Status: AC
  Filled 2013-02-08: qty 5

## 2013-02-08 MED ORDER — GABAPENTIN 100 MG PO CAPS
100.0000 mg | ORAL_CAPSULE | Freq: Two times a day (BID) | ORAL | Status: DC
  Administered 2013-02-08 – 2013-02-10 (×4): 100 mg via ORAL
  Filled 2013-02-08 (×5): qty 1

## 2013-02-08 MED ORDER — HYDROCHLOROTHIAZIDE 12.5 MG PO CAPS
12.5000 mg | ORAL_CAPSULE | Freq: Every day | ORAL | Status: DC
  Administered 2013-02-08 – 2013-02-10 (×3): 12.5 mg via ORAL
  Filled 2013-02-08 (×3): qty 1

## 2013-02-08 MED ORDER — HYDROMORPHONE HCL PF 1 MG/ML IJ SOLN
INTRAMUSCULAR | Status: AC
  Administered 2013-02-08: 1 mg via INTRAVENOUS
  Filled 2013-02-08: qty 1

## 2013-02-08 MED ORDER — SUFENTANIL CITRATE 50 MCG/ML IV SOLN
INTRAVENOUS | Status: DC | PRN
  Administered 2013-02-08 (×2): 10 ug via INTRAVENOUS
  Administered 2013-02-08 (×2): 5 ug via INTRAVENOUS
  Administered 2013-02-08: 15 ug via INTRAVENOUS

## 2013-02-08 MED ORDER — LACTATED RINGERS IR SOLN
Status: DC | PRN
  Administered 2013-02-08: 1

## 2013-02-08 MED ORDER — LACTATED RINGERS IV SOLN: INTRAVENOUS | Status: DC

## 2013-02-08 MED ORDER — ROCURONIUM BROMIDE 100 MG/10ML IV SOLN
INTRAVENOUS | Status: DC | PRN
  Administered 2013-02-08: 35 mg via INTRAVENOUS
  Administered 2013-02-08: 5 mg via INTRAVENOUS

## 2013-02-08 MED ORDER — LIDOCAINE HCL (CARDIAC) 20 MG/ML IV SOLN
INTRAVENOUS | Status: DC | PRN
  Administered 2013-02-08: 100 mg via INTRAVENOUS

## 2013-02-08 MED ORDER — HYDROMORPHONE HCL PF 1 MG/ML IJ SOLN
0.2500 mg | INTRAMUSCULAR | Status: DC | PRN
  Administered 2013-02-08 (×4): 0.5 mg via INTRAVENOUS

## 2013-02-08 MED ORDER — NEOSTIGMINE METHYLSULFATE 1 MG/ML IJ SOLN
INTRAMUSCULAR | Status: AC
  Filled 2013-02-08: qty 10

## 2013-02-08 MED ORDER — LABETALOL HCL 5 MG/ML IV SOLN
INTRAVENOUS | Status: DC | PRN
  Administered 2013-02-08: 5 mg via INTRAVENOUS

## 2013-02-08 SURGICAL SUPPLY — 36 items
APPLIER CLIP ROT 10 11.4 M/L (STAPLE) ×3
BENZOIN TINCTURE PRP APPL 2/3 (GAUZE/BANDAGES/DRESSINGS) ×3 IMPLANT
CABLE HIGH FREQUENCY MONO STRZ (ELECTRODE) ×3 IMPLANT
CANISTER SUCTION 2500CC (MISCELLANEOUS) ×3 IMPLANT
CLIP APPLIE ROT 10 11.4 M/L (STAPLE) ×2 IMPLANT
CLOSURE STERI-STRIP 1/4X4 (GAUZE/BANDAGES/DRESSINGS) ×3 IMPLANT
COVER MAYO STAND STRL (DRAPES) ×3 IMPLANT
DECANTER SPIKE VIAL GLASS SM (MISCELLANEOUS) ×3 IMPLANT
DRAPE C-ARM 42X120 X-RAY (DRAPES) ×3 IMPLANT
DRAPE LAPAROSCOPIC ABDOMINAL (DRAPES) ×3 IMPLANT
DRAPE UTILITY XL STRL (DRAPES) ×3 IMPLANT
ELECT REM PT RETURN 9FT ADLT (ELECTROSURGICAL) ×3
ELECTRODE REM PT RTRN 9FT ADLT (ELECTROSURGICAL) ×2 IMPLANT
GAUZE SPONGE 2X2 8PLY STRL LF (GAUZE/BANDAGES/DRESSINGS) ×2 IMPLANT
GLOVE SURG ORTHO 8.0 STRL STRW (GLOVE) ×3 IMPLANT
GOWN STRL REIN XL XLG (GOWN DISPOSABLE) ×6 IMPLANT
HEMOSTAT SURGICEL 4X8 (HEMOSTASIS) IMPLANT
KIT BASIN OR (CUSTOM PROCEDURE TRAY) ×3 IMPLANT
NS IRRIG 1000ML POUR BTL (IV SOLUTION) ×3 IMPLANT
POUCH SPECIMEN RETRIEVAL 10MM (ENDOMECHANICALS) ×3 IMPLANT
SCISSORS LAP 5X35 DISP (ENDOMECHANICALS) ×3 IMPLANT
SET CHOLANGIOGRAPH MIX (MISCELLANEOUS) ×3 IMPLANT
SET IRRIG TUBING LAPAROSCOPIC (IRRIGATION / IRRIGATOR) ×3 IMPLANT
SLEEVE XCEL OPT CAN 5 100 (ENDOMECHANICALS) ×3 IMPLANT
SOLUTION ANTI FOG 6CC (MISCELLANEOUS) ×3 IMPLANT
SPONGE GAUZE 2X2 STER 10/PKG (GAUZE/BANDAGES/DRESSINGS) ×1
STRIP CLOSURE SKIN 1/2X4 (GAUZE/BANDAGES/DRESSINGS) ×3 IMPLANT
SUT VIC AB 4-0 PS2 27 (SUTURE) ×3 IMPLANT
TAPE CLOTH SURG 4X10 WHT LF (GAUZE/BANDAGES/DRESSINGS) ×3 IMPLANT
TOWEL OR 17X26 10 PK STRL BLUE (TOWEL DISPOSABLE) ×3 IMPLANT
TOWEL OR NON WOVEN STRL DISP B (DISPOSABLE) ×3 IMPLANT
TRAY LAP CHOLE (CUSTOM PROCEDURE TRAY) ×3 IMPLANT
TROCAR BLADELESS OPT 5 100 (ENDOMECHANICALS) ×3 IMPLANT
TROCAR XCEL BLUNT TIP 100MML (ENDOMECHANICALS) ×3 IMPLANT
TROCAR XCEL NON-BLD 11X100MML (ENDOMECHANICALS) ×3 IMPLANT
TUBING INSUFFLATION 10FT LAP (TUBING) ×3 IMPLANT

## 2013-02-08 NOTE — Transfer of Care (Signed)
Immediate Anesthesia Transfer of Care Note  Patient: Jessica Walker  Procedure(s) Performed: Procedure(s): LAPAROSCOPIC CHOLECYSTECTOMY WITH INTRAOPERATIVE CHOLANGIOGRAM (N/A)  Patient Location: PACU  Anesthesia Type:General  Level of Consciousness: awake, alert , oriented and patient cooperative  Airway & Oxygen Therapy: Patient Spontanous Breathing and Patient connected to face mask oxygen  Post-op Assessment: Report given to PACU RN, Post -op Vital signs reviewed and stable and Patient moving all extremities X 4  Post vital signs: stable  Complications: No apparent anesthesia complications

## 2013-02-08 NOTE — Preoperative (Signed)
Beta Blockers   Reason not to administer Beta Blockers:Hold beta blocker due to other potential hypotensuion will give after induction

## 2013-02-08 NOTE — Op Note (Signed)
Procedure Note  Pre-operative Diagnosis:  Acute cholecystitis, cholelithiasis  Post-operative Diagnosis:  same  Surgeon:  Velora Heckler, MD, FACS  Assistant:  Lodema Pilot, DO   Procedure:  Laparoscopic cholecystectomy with intra-operative cholangiography  Anesthesia:  General  Estimated Blood Loss:  minimal  Drains: none         Specimen: Gallbladder to pathology  Indications:  This patient presents with symptomatic gallbladder disease and will undergo laparoscopic cholecystectomy with intraoperative cholangiography.  Procedure Details:  The patient was seen in the pre-op holding area. The risks, benefits, complications, treatment options, and expected outcomes have been discussed with the patient. The patient and/or family agreed with the proposed plan and signed the informed consent form.  The patient was taken to Operating Room, identified as Jessica Walker and the procedure verified as Laparoscopic Cholecystectomy with Intraoperative Cholangiogram. A "time out" was completed and the above information confirmed.  Following induction of general anesthesia, the patient was placed in the supine position. The abdomen was prepped and draped in the usual aseptic fashion.  An incision was made in the skin below the umbilicus. The midline fascia was incised and the peritoneal cavity entered and the Hasson canula was introduced under direct vision.  The Hasson canula was secured with a 0-Vicryl pursestring suture. Pneumoperitoneum was established with carbon dioxide. Additional trocars were introduced under direct vision along the right costal margin in the midline, mid-clavicular line, and anterior axillary line.   The gallbladder was identified and the fundus grasped and retracted cephalad. There was significant inflammatory changes and thickening of the gallbladder wall.  The gallbladder was punctured and aspirated to allow for grasping and retracting. Adhesions were taken down bluntly and  with the electrocautery as needed, taking care not to injure any adjacent structures. The infundibulum was grasped and retracted laterally, exposing the peritoneum overlying the triangle of Calot. This was incised and structures exposed in a blunt fashion. The cystic duct was clearly identified and bluntly dissected circumferentially and clipped at the neck of the gallbladder.  An incision was made in the cystic duct and the cholangiogram catheter introduced. The catheter was secured using an ligaclip.  Real-time cholangiography was performed using the C-arm.  There was rapid filling of a normal caliber common bile duct.  There was reflux of contrast into the left and right hepatic ductal systems.  There was free flow distally into the duodenum without filling defect or obstruction.  Catheter was removed from the peritoneal cavity.  The cystic duct was then triply ligated with surgical clips and divided. The cystic artery was identified, dissected circumferentially, ligated with ligaclips, and divided.  The gallbladder was dissected from the liver bed with the electrocautery used for hemostasis. The gallbladder was completely removed and placed into an endocatch bag. The right upper quadrant was irrigated and inspected. Hemostasis was achieved with the electrocautery. Warm saline irrigation was utilized and was repeatedly aspirated until clear.  Pneumoperitoneum was released after viewing removal of the trocars with good hemostasis noted. The umbilical wound was irrigated and the fascia was then closed with the pursestring suture.  The skin incisions were closed with 4-0 Monocril subcuticular sutures and steri-strips and dressings were applied.  Instrument, sponge, and needle counts were correct at the conclusion of the case.  The patient was awakened from anesthesia and brought to the recovery room in stable condition.  The patient tolerated the procedure well.   Velora Heckler, MD, [facility] Office: [phone]

## 2013-02-08 NOTE — Anesthesia Preprocedure Evaluation (Addendum)
Anesthesia Evaluation  Patient identified by MRN, date of birth, ID band Patient awake    Reviewed: Allergy & Precautions, H&P , NPO status , Patient's Chart, lab work & pertinent test results, reviewed documented beta blocker date and time   Airway Mallampati: II TM Distance: >3 FB Neck ROM: full    Dental  (+) Edentulous Upper, Edentulous Lower and Dental Advisory Given   Pulmonary neg pulmonary ROS, Current Smoker,  breath sounds clear to auscultation  Pulmonary exam normal       Cardiovascular Exercise Tolerance: Good hypertension, Pt. on home beta blockers negative cardio ROS  Rhythm:regular Rate:Normal     Neuro/Psych negative neurological ROS  negative psych ROS   GI/Hepatic negative GI ROS, Neg liver ROS, GERD-  Medicated and Controlled,  Endo/Other  negative endocrine ROSAdenoma adrenal gland  Renal/GU negative Renal ROS  negative genitourinary   Musculoskeletal   Abdominal   Peds  Hematology negative hematology ROS (+)   Anesthesia Other Findings   Reproductive/Obstetrics negative OB ROS                          Anesthesia Physical Anesthesia Plan  ASA: II  Anesthesia Plan: General   Post-op Pain Management:    Induction: Intravenous  Airway Management Planned: Oral ETT  Additional Equipment:   Intra-op Plan:   Post-operative Plan: Extubation in OR  Informed Consent: I have reviewed the patients History and Physical, chart, labs and discussed the procedure including the risks, benefits and alternatives for the proposed anesthesia with the patient or authorized representative who has indicated his/her understanding and acceptance.   Dental Advisory Given  Plan Discussed with: CRNA and Surgeon  Anesthesia Plan Comments:         Anesthesia Quick Evaluation

## 2013-02-08 NOTE — Progress Notes (Signed)
pacu nursing -  Dentures returned to patient

## 2013-02-08 NOTE — Progress Notes (Signed)
Utilization Review completed.  

## 2013-02-08 NOTE — Anesthesia Postprocedure Evaluation (Signed)
  Anesthesia Post-op Note  Patient: Jessica Walker  Procedure(s) Performed: Procedure(s) (LRB): LAPAROSCOPIC CHOLECYSTECTOMY WITH INTRAOPERATIVE CHOLANGIOGRAM (N/A)  Patient Location: PACU  Anesthesia Type: General  Level of Consciousness: awake and alert   Airway and Oxygen Therapy: Patient Spontanous Breathing  Post-op Pain: mild  Post-op Assessment: Post-op Vital signs reviewed, Patient's Cardiovascular Status Stable, Respiratory Function Stable, Patent Airway and No signs of Nausea or vomiting  Last Vitals:  Filed Vitals:   02/08/13 1445  BP: 128/57  Pulse: 64  Temp: 37 C  Resp: 14    Post-op Vital Signs: stable   Complications: No apparent anesthesia complications

## 2013-02-09 LAB — CBC
HCT: 33 % — ABNORMAL LOW (ref 36.0–46.0)
MCHC: 33.3 g/dL (ref 30.0–36.0)
MCV: 82.7 fL (ref 78.0–100.0)
RBC: 3.99 MIL/uL (ref 3.87–5.11)
RDW: 15 % (ref 11.5–15.5)
WBC: 13 10*3/uL — ABNORMAL HIGH (ref 4.0–10.5)

## 2013-02-09 LAB — BASIC METABOLIC PANEL
CO2: 26 mEq/L (ref 19–32)
Chloride: 100 mEq/L (ref 96–112)
Creatinine, Ser: 0.55 mg/dL (ref 0.50–1.10)
GFR calc Af Amer: >90 mL/min (ref >90)
GFR calc non Af Amer: >90 mL/min (ref >90)
Potassium: 3.3 mEq/L — ABNORMAL LOW (ref 3.5–5.1)
Sodium: 139 mEq/L (ref 135–145)

## 2013-02-09 MED ORDER — PANTOPRAZOLE SODIUM 40 MG PO TBEC
40.0000 mg | DELAYED_RELEASE_TABLET | Freq: Every day | ORAL | Status: DC
  Administered 2013-02-09 – 2013-02-10 (×2): 40 mg via ORAL
  Filled 2013-02-09 (×2): qty 1

## 2013-02-09 NOTE — Progress Notes (Signed)
Patient ID: Jessica Walker, female   DOB: March 03, 1951, 62 y.o.   MRN: [id number]  General Surgery - [facility] - Progress Note  POD# 1  Subjective: Patient with mild nausea after po pain Rx.  Taking limited po diet.  Ambulatory.  Family at bedside.  Objective: Vital signs in last 24 hours: Temp:  [97.9 F (36.6 C)-98.9 F (37.2 C)] 98.3 F (36.8 C) (11/30 0450) Pulse Rate:  [60-79] 60 (11/30 0450) Resp:  [11-20] 18 (11/30 0450) BP: (92-150)/(57-91) 126/67 mmHg (11/30 0450) SpO2:  [90 %-100 %] 90 % (11/30 0450) Last BM Date: 02/06/13  Intake/Output from previous day: 11/29 0701 - 11/30 0700 In: 4085 [I.V.:3485; IV Piggyback:600] Out: 2650 [Urine:2650]  Exam: HEENT - clear, not icteric Neck - soft Chest - clear bilaterally Cor - RRR, no murmur Abd - soft, mild distension; dressings dry and intact; mild tenderness Ext - no significant edema Neuro - grossly intact, no focal deficits  Lab Results:   Recent Labs  02/07/13 1600 02/09/13 0500  WBC 16.0* 13.0*  HGB 12.6 11.0*  HCT 37.9 33.0*  PLT 281 288     Recent Labs  02/07/13 1600 02/08/13 1020 02/09/13 0500  NA 137  --  139  K 2.7* 2.9* 3.3*  CL 97  --  100  CO2 28  --  26  GLUCOSE 94  --  158*  BUN 13  --  5*  CREATININE 0.70  --  0.55  CALCIUM 9.3  --  9.1    Studies/Results: Dg Cholangiogram Operative  02/08/2013   CLINICAL DATA:  Cholecystectomy.  EXAM: INTRAOPERATIVE CHOLANGIOGRAM  TECHNIQUE: Cholangiographic images from the C-arm fluoroscopic device were submitted for interpretation post-operatively. Please see the procedural report for the amount of contrast and the fluoroscopy time utilized.  COMPARISON:  None.  FINDINGS: Images from intraoperative cholangiogram evaluated. Contrast is noted throughout the whole biliary system. Biliary system is widely patent and nondilated. No filling defects noted.  IMPRESSION: Normal exam.   Electronically Signed   By: Maisie Fus  Register   On:  02/08/2013 13:19   Ct Abdomen Pelvis W Contrast  02/07/2013   CLINICAL DATA:  Abdominal pain  EXAM: CT ABDOMEN AND PELVIS WITH CONTRAST  TECHNIQUE: Multidetector CT imaging of the abdomen and pelvis was performed using the standard protocol following bolus administration of intravenous contrast.  CONTRAST:  50mL OMNIPAQUE IOHEXOL 300 MG/ML SOLN, OMNIPAQUE IOHEXOL 300 MG/ML SOLN  COMPARISON:  08/15/2012  FINDINGS: There is severe wall thickening of the gallbladder. There is inflammatory changes in the adjacent fat. No definite stones can be visualized.  Stable tiny hypodensity in the right lobe of the liver.  Stable left adrenal adenoma.  Right adrenal gland is unremarkable.  Pancreas is somewhat atrophic.  Kidneys are unremarkable.  Bladder, uterus, and adnexa are unremarkable.  Normal appendix.  IMPRESSION: There is severe wall thickening of the gallbladder associated with inflammatory change in the adjacent fat. Findings are worrisome for acute cholecystitis. Ultrasound may be helpful to further characterize.   Electronically Signed   By: Maryclare Bean M.D.   On: 02/07/2013 18:11    Assessment / Plan: 1.  Status post lap chole for acute cholecystitis, cholelithiasis  Continue IV Unasyn - would send home on po Augmentin x 5 days  Encourage OOB, ambulation today  Rx nausea, pain  Likely home tomorrow if progressing  Velora Heckler, MD, [facility] Office: [phone]  02/09/2013

## 2013-02-09 NOTE — Progress Notes (Signed)
Utilization Review completed.  

## 2013-02-10 ENCOUNTER — Encounter (HOSPITAL_COMMUNITY): Payer: Self-pay | Admitting: Surgery

## 2013-02-10 LAB — CBC
HCT: 31.1 % — ABNORMAL LOW (ref 36.0–46.0)
Hemoglobin: 10.3 g/dL — ABNORMAL LOW (ref 12.0–15.0)
Platelets: 296 10*3/uL (ref 150–400)
RBC: 3.77 MIL/uL — ABNORMAL LOW (ref 3.87–5.11)
WBC: 10 10*3/uL (ref 4.0–10.5)

## 2013-02-10 LAB — BASIC METABOLIC PANEL
CO2: 28 mEq/L (ref 19–32)
Chloride: 103 mEq/L (ref 96–112)
GFR calc non Af Amer: >90 mL/min (ref >90)
Glucose, Bld: 100 mg/dL — ABNORMAL HIGH (ref 70–99)
Potassium: 3.3 mEq/L — ABNORMAL LOW (ref 3.5–5.1)
Sodium: 141 mEq/L (ref 135–145)

## 2013-02-10 MED ORDER — ACETAMINOPHEN 325 MG PO TABS: 650.0000 mg | ORAL_TABLET | Freq: Four times a day (QID) | ORAL | Status: DC | PRN

## 2013-02-10 MED ORDER — HYDROCODONE-ACETAMINOPHEN 5-325 MG PO TABS: 1.0000 | ORAL_TABLET | ORAL | Status: DC | PRN

## 2013-02-10 NOTE — Progress Notes (Signed)
2 Days Post-Op  Subjective: Doing well, still fairly sore.  Works at her own shop.  Objective: Vital signs in last 24 hours: Temp:  [98.2 F (36.8 C)-98.5 F (36.9 C)] 98.2 F (36.8 C) (12/01 0500) Pulse Rate:  [53-62] 53 (12/01 0500) Resp:  [16-18] 16 (12/01 0500) BP: (137-158)/(66-74) 137/66 mmHg (12/01 0500) SpO2:  [93 %-98 %] 98 % (12/01 0500) Last BM Date: 02/06/13 Diet: regular Afebrile, VSS K+ 3.3 Post op IOC was negative. Intake/Output from previous day: 11/30 0701 - 12/01 0700 In: 1984.2 [P.O.:240; I.V.:1344.2; IV Piggyback:400] Out: 1050 [Urine:1050] Intake/Output this shift:    General appearance: alert, cooperative and no distress GI: soft tender, port sites look fine.  Lab Results:   Recent Labs  02/09/13 0500 02/10/13 0520  WBC 13.0* 10.0  HGB 11.0* 10.3*  HCT 33.0* 31.1*  PLT 288 296    BMET  Recent Labs  02/09/13 0500 02/10/13 0520  NA 139 141  K 3.3* 3.3*  CL 100 103  CO2 26 28  GLUCOSE 158* 100*  BUN 5* 7  CREATININE 0.55 0.65  CALCIUM 9.1 8.9   PT/INR No results found for this basename: LABPROT, INR,  in the last 72 hours   Recent Labs Lab 02/07/13 1600  AST 17  ALT 15  ALKPHOS 79  BILITOT 0.6  PROT 7.7  ALBUMIN 3.4*     Lipase     Component Value Date/Time   LIPASE 8* 02/07/2013 1600     Studies/Results: Dg Cholangiogram Operative  02/08/2013   CLINICAL DATA:  Cholecystectomy.  EXAM: INTRAOPERATIVE CHOLANGIOGRAM  TECHNIQUE: Cholangiographic images from the C-arm fluoroscopic device were submitted for interpretation post-operatively. Please see the procedural report for the amount of contrast and the fluoroscopy time utilized.  COMPARISON:  None.  FINDINGS: Images from intraoperative cholangiogram evaluated. Contrast is noted throughout the whole biliary system. Biliary system is widely patent and nondilated. No filling defects noted.  IMPRESSION: Normal exam.   Electronically Signed   By: Maisie Fus  Register   On:  02/08/2013 13:19    Medications: . ampicillin-sulbactam (UNASYN) IV  3 g Intravenous Q6H  . atenolol  50 mg Oral BID  . gabapentin  100 mg Oral BID  . hydrochlorothiazide  12.5 mg Oral Daily  . pantoprazole  40 mg Oral Daily  . sertraline  25 mg Oral Daily   Prior to Admission medications   Medication Sig Start Date End Date Taking? Authorizing Provider  atenolol (TENORMIN) 50 MG tablet Take 50 mg by mouth 2 (two) times daily.     Yes Historical Provider, MD  Dihydroxyaluminum Sod Carb (ACID RELIEF PO) Take 1 tablet by mouth daily.     Yes Historical Provider, MD  gabapentin (NEURONTIN) 100 MG capsule Take 100 mg by mouth 2 (two) times daily.   Yes Historical Provider, MD  hydrochlorothiazide (HYDRODIURIL) 25 MG tablet Take 12.5 mg by mouth daily.     Yes Historical Provider, MD  methocarbamol (ROBAXIN) 500 MG tablet Take 500 mg by mouth 4 (four) times daily.   Yes Historical Provider, MD  sertraline (ZOLOFT) 25 MG tablet Take 25 mg by mouth daily.   Yes Historical Provider, MD     Assessment/Plan Acute cholecystitis, cholelithiasis S/p Laparoscopic cholecystectomy with intra-operative cholangiography, 02/08/2013, Velora Heckler, MD. Hx of Depression Hypertension GERD Adenoma of left adrenal     Plan:  Home today, follow up in clinic.   LOS: 3 days    Jessica Walker 02/10/2013

## 2013-02-12 NOTE — Discharge Summary (Signed)
Physician Discharge Summary  Patient ID: Jessica Walker MRN: [id number] DOB/AGE: 61-Nov-1952 61 y.o.  Admit date: 02/07/2013 Discharge date: 02/12/2013  Admission Diagnoses: Acute cholecystitis, cholelithiasis Hx of Depression  Hypertension  GERD Adenoma of left adrenal   Discharge Diagnoses: Same Principal Problem:   Acute cholecystitis Active Problems:   Acute calculous cholecystitis   PROCEDURES: S/p Laparoscopic cholecystectomy with intra-operative cholangiography, 02/08/2013, Velora Heckler, MD   Hospital Course: the patient is a 62 year old female transferred [facility] long emergency department for assessment by general surgery. The patient had presented with a three-day history of abdominal pain localized to the upper abdomen. Evaluation in the emergency department showed an elevated white blood cell count of 16,000. CT scan of the abdomen and pelvis was obtained which showed findings consistent with acute cholecystitis. Patient was transferred for general surgical care.  Patient has had a previous cesarean section. She has a history of gastroesophageal reflux and has undergone dilatations in the past. She denies any previous hepatobiliary or pancreatic disease. She denies acholic stools. She denies jaundice. She denies fevers or chills.  She was taken to the OR as noted above, and had cholecystectomy.  She had a fairly slow recovery with poor PO intake at first.  By 02/10/13 she was tolerating PO's and mobilizing.  She was ready for discharge at that time. She will follow up in clinic.  Condition on D/C:  improved   Disposition: 01-Home or Self Care   Future Appointments Provider Department Dept Phone   03/04/2013 2:00 PM [facility] [place] [phone]       Medication List         acetaminophen 325 MG tablet  Commonly known as:  TYLENOL  Take 2 tablets (650 mg total) by mouth every 6 (six) hours as needed  for mild pain (Do not take more than 4000 mg of tylenol (acetaminophen) per day.  It is in your prescribed pain medication, so you have to count it.).     ACID RELIEF PO  Take 1 tablet by mouth daily.     atenolol 50 MG tablet  Commonly known as:  TENORMIN  Take 50 mg by mouth 2 (two) times daily.     gabapentin 100 MG capsule  Commonly known as:  NEURONTIN  Take 100 mg by mouth 2 (two) times daily.     hydrochlorothiazide 25 MG tablet  Commonly known as:  HYDRODIURIL  Take 12.5 mg by mouth daily.     HYDROcodone-acetaminophen 5-325 MG per tablet  Commonly known as:  NORCO/VICODIN  Take 1-2 tablets by mouth every 4 (four) hours as needed for moderate pain.     methocarbamol 500 MG tablet  Commonly known as:  ROBAXIN  Take 500 mg by mouth 4 (four) times daily.     sertraline 25 MG tablet  Commonly known as:  ZOLOFT  Take 25 mg by mouth daily.           Follow-up Information   Follow up with [facility] On 03/04/2013. (Your appointment is at 2 PM, be there at 1:30PM for check in.)    Contact information:   [address]   [place] [address] [phone]       Signed: Sherrie George 02/12/2013, 12:22 PM

## 2013-03-04 ENCOUNTER — Encounter (INDEPENDENT_AMBULATORY_CARE_PROVIDER_SITE_OTHER): Payer: Self-pay

## 2013-03-04 ENCOUNTER — Ambulatory Visit (INDEPENDENT_AMBULATORY_CARE_PROVIDER_SITE_OTHER): Payer: PRIVATE HEALTH INSURANCE | Admitting: General Surgery

## 2013-03-04 VITALS — BP 144/82 | HR 64 | Temp 98.0°F | Resp 16 | Ht 63.0 in | Wt 162.0 lb

## 2013-03-04 DIAGNOSIS — K810 Acute cholecystitis: Secondary | ICD-10-CM

## 2013-03-04 NOTE — Patient Instructions (Signed)
Call if you have any more problems.  Have a good Christmas.

## 2013-03-04 NOTE — Progress Notes (Signed)
Jessica Walker 06-01-1950 [id number] 03/04/2013   Jessica Walker is a 62 y.o. female who had a laparoscopic cholecystectomy with intraoperative cholangiogram by Dr. Velora Heckler, MD, FACS .  The pathology report confirmed Gallbladder - CHRONIC ACTIVE CHOLECYSTITIS WITH ABSCESS. - NEGATIVE FOR CHOLELITHIASIS. - INCIDENTAL BENIGN LIVER. .  The patient reports that they are feeling well with normal bowel movements and good appetite.  The pre-operative symptoms of abdominal pain, nausea, and vomiting have resolved.    Physical examination - BP 144/82  Pulse 64  Temp(Src) 98 F (36.7 C) (Temporal)  Resp 16  Ht 5\' 3"  (1.6 m)  Wt 73.483 kg (162 lb)  BMI 28.70 kg/m2  Incisions appear well-healed with no sign of infection or bleeding.   Abdomen - soft, non-tender  Impression:  s/p laparoscopic cholecystectomy  Plan:  She may resume a regular diet and full activity.  She may follow-up on a PRN basis. She feels allot better.

## 2013-05-06 ENCOUNTER — Emergency Department (HOSPITAL_BASED_OUTPATIENT_CLINIC_OR_DEPARTMENT_OTHER)
Admission: EM | Admit: 2013-05-06 | Discharge: 2013-05-06 | Disposition: A | Discharge status: Home / Self Care | Payer: No Typology Code available for payment source | Attending: Emergency Medicine | Admitting: Emergency Medicine

## 2013-05-06 ENCOUNTER — Encounter (HOSPITAL_BASED_OUTPATIENT_CLINIC_OR_DEPARTMENT_OTHER): Payer: Self-pay | Admitting: Emergency Medicine

## 2013-05-06 DIAGNOSIS — K219 Gastro-esophageal reflux disease without esophagitis: Secondary | ICD-10-CM | POA: Insufficient documentation

## 2013-05-06 DIAGNOSIS — F3289 Other specified depressive episodes: Secondary | ICD-10-CM | POA: Insufficient documentation

## 2013-05-06 DIAGNOSIS — I10 Essential (primary) hypertension: Secondary | ICD-10-CM | POA: Insufficient documentation

## 2013-05-06 DIAGNOSIS — Z87891 Personal history of nicotine dependence: Secondary | ICD-10-CM | POA: Insufficient documentation

## 2013-05-06 DIAGNOSIS — Z8739 Personal history of other diseases of the musculoskeletal system and connective tissue: Secondary | ICD-10-CM | POA: Insufficient documentation

## 2013-05-06 DIAGNOSIS — Z8639 Personal history of other endocrine, nutritional and metabolic disease: Secondary | ICD-10-CM | POA: Insufficient documentation

## 2013-05-06 DIAGNOSIS — F329 Major depressive disorder, single episode, unspecified: Secondary | ICD-10-CM | POA: Insufficient documentation

## 2013-05-06 DIAGNOSIS — Z862 Personal history of diseases of the blood and blood-forming organs and certain disorders involving the immune mechanism: Secondary | ICD-10-CM | POA: Insufficient documentation

## 2013-05-06 DIAGNOSIS — Z79899 Other long term (current) drug therapy: Secondary | ICD-10-CM | POA: Insufficient documentation

## 2013-05-06 DIAGNOSIS — M5430 Sciatica, unspecified side: Secondary | ICD-10-CM | POA: Insufficient documentation

## 2013-05-06 DIAGNOSIS — Z791 Long term (current) use of non-steroidal anti-inflammatories (NSAID): Secondary | ICD-10-CM | POA: Insufficient documentation

## 2013-05-06 MED ORDER — CYCLOBENZAPRINE HCL 5 MG PO TABS: 5.0000 mg | ORAL_TABLET | Freq: Three times a day (TID) | ORAL | Status: DC | PRN

## 2013-05-06 MED ORDER — HYDROCODONE-ACETAMINOPHEN 5-325 MG PO TABS: 1.0000 | ORAL_TABLET | ORAL | Status: DC | PRN

## 2013-05-06 NOTE — ED Notes (Signed)
Pt c/o right lower back pain which radiates down right leg x 1 month

## 2013-05-06 NOTE — ED Provider Notes (Signed)
CSN: [id number]     Arrival date & time 05/06/13  1800 History  This chart was scribed for Kathalene Frames, MD by Zettie Pho, ED Scribe. This patient was seen in room MH01/MH01 and the patient's care was started at 6:55 PM.    Chief Complaint  Patient presents with  . Back Pain   The history is provided by the patient. No language interpreter was used.   HPI Comments: Rayne Cowdrey is a 63 y.o. female with a history of arthritis who presents to the Emergency Department complaining of an intermittent pain to the right side of the lower back, described as feeling like spasms, that radiates down the right leg that she states has been ongoing for the past month. She reports some associated, intermittent, right-sided paresthesias. Patient reports taking gabapentin and Naproxen at home with temporary relief of her symptoms. She denies any potential injury or trauma to the area, recent falls, or heavy lifting. Patient reports that she has had similar symptoms in the past that were effectively alleviated for about a year with injections to the groin area (patient is unable to specify the medication) and pain medication. Patient has an allergy to aspirin. Patient has a history of depression, HTN, GERD.   Past Medical History  Diagnosis Date  . Depression   . Hypertension   . GERD (gastroesophageal reflux disease)   . Adenoma of left adrenal gland   . Arthritis    Past Surgical History  Procedure Laterality Date  . Cholecystectomy N/A 02/08/2013    Procedure: LAPAROSCOPIC CHOLECYSTECTOMY WITH INTRAOPERATIVE CHOLANGIOGRAM;  Surgeon: Earnstine Regal, MD;  Location: [facility];  Service: General;  Laterality: N/A;  . Cesarean section     Family History  Problem Relation Age of Onset  . Sudden death Neg Hx   . Heart attack Neg Hx   . Hyperlipidemia Neg Hx   . Hypertension Neg Hx   . Diabetes Neg Hx    History  Substance Use Topics  . Smoking status: Former Smoker    Types: Cigarettes  . Smokeless  tobacco: Not on file     Comment: 3 cigareets per day  . Alcohol Use: No   OB History   Grav Para Term Preterm Abortions TAB SAB Ect Mult Living                 Review of Systems  A complete 10 system review of systems was obtained and all systems are negative except as noted in the HPI and PMH.    Allergies  Aspirin and Eggs or egg-derived products  Home Medications   Current Outpatient Rx  Name  Route  Sig  Dispense  Refill  . atenolol (TENORMIN) 50 MG tablet   Oral   Take 50 mg by mouth 2 (two) times daily.           . Calcium Carbonate-Vitamin D (CALCIUM + D PO)   Oral   Take by mouth.         . cyclobenzaprine (FLEXERIL) 5 MG tablet   Oral   Take 1 tablet (5 mg total) by mouth 3 (three) times daily as needed for muscle spasms.   21 tablet   0   . gabapentin (NEURONTIN) 100 MG capsule   Oral   Take 100 mg by mouth 2 (two) times daily.         . hydrochlorothiazide (HYDRODIURIL) 25 MG tablet   Oral   Take 12.5 mg by mouth daily.           Marland Kitchen  HYDROcodone-acetaminophen (NORCO/VICODIN) 5-325 MG per tablet   Oral   Take 1-2 tablets by mouth every 4 (four) hours as needed for moderate pain.   40 tablet   0   . HYDROcodone-acetaminophen (NORCO/VICODIN) 5-325 MG per tablet   Oral   Take 1-2 tablets by mouth every 4 (four) hours as needed.   16 tablet   0   . naproxen (NAPROSYN) 500 MG tablet   Oral   Take 500 mg by mouth 2 (two) times daily with a meal.         . omeprazole (PRILOSEC) 40 MG capsule   Oral   Take 40 mg by mouth daily.         . sertraline (ZOLOFT) 25 MG tablet   Oral   Take 25 mg by mouth daily.          Triage Vitals: BP 115/47  Pulse 57  Temp(Src) 98.5 F (36.9 C) (Oral)  Resp 16  Ht 5\' 3"  (1.6 m)  Wt 150 lb (68.04 kg)  BMI 26.58 kg/m2  SpO2 96%  Physical Exam  Nursing note and vitals reviewed. Constitutional: She is oriented to person, place, and time. She appears well-developed and well-nourished. No  distress.  HENT:  Head: Normocephalic and atraumatic.  Eyes: Conjunctivae are normal.  Neck: Normal range of motion. Neck supple.  Cardiovascular: Intact distal pulses.   Pulmonary/Chest: Effort normal. No respiratory distress.  Abdominal: Soft. She exhibits no distension and no mass. There is no tenderness.  No hernia, no inguinal mass or ttp  Musculoskeletal: Normal range of motion.  Neurological: She is alert and oriented to person, place, and time. She has normal reflexes. She displays normal reflexes.  Reflex Scores:      Patellar reflexes are 2+ on the right side and 2+ on the left side. Normal strength and sensation throughout.   Skin: Skin is warm and dry.  Psychiatric: She has a normal mood and affect. Her behavior is normal.    ED Course  Procedures (including critical care time)  DIAGNOSTIC STUDIES: Oxygen Saturation is 96% on room air, normal by my interpretation.    COORDINATION OF CARE: 6:59 PM- Discussed that symptoms may be due to sciatica. Will order and discharge patient with pain medication and muscle relaxants to manage symptoms. Advised patient to follow up with a specialist to possibly receive an MRI, if appropriate. Discussed treatment plan with patient at bedside and patient verbalized agreement.     Labs Review Labs Reviewed - No data to display Imaging Review No results found.  EKG Interpretation   None       MDM   Final diagnoses:  Sciatica   Pt has history of recurrent back pain.  Pt describes some type of injections in the past but does not think she had an MRI.  Pt went to PCP but could not wait.  Will dc home with pain meds and muscle relaxant.  Recc follow up with PCP for further outpatient evaluation especially considering they will have the records of what she had done previously.  I personally performed the services described in this documentation, which was scribed in my presence.  The recorded information has been reviewed and is  accurate.     Kathalene Frames, MD 05/06/13 [phone]

## 2013-05-06 NOTE — Discharge Instructions (Signed)
Sciatica °Sciatica is pain, weakness, numbness, or tingling along the path of the sciatic nerve. The nerve starts in the lower back and runs down the back of each leg. The nerve controls the muscles in the lower leg and in the back of the knee, while also providing sensation to the back of the thigh, lower leg, and the sole of your foot. Sciatica is a symptom of another medical condition. For instance, nerve damage or certain conditions, such as a herniated disk or bone spur on the spine, pinch or put pressure on the sciatic nerve. This causes the pain, weakness, or other sensations normally associated with sciatica. Generally, sciatica only affects one side of the body. °CAUSES  °· Herniated or slipped disc. °· Degenerative disk disease. °· A pain disorder involving the narrow muscle in the buttocks (piriformis syndrome). °· Pelvic injury or fracture. °· Pregnancy. °· Tumor (rare). °SYMPTOMS  °Symptoms can vary from mild to very severe. The symptoms usually travel from the low back to the buttocks and down the back of the leg. Symptoms can include: °· Mild tingling or dull aches in the lower back, leg, or hip. °· Numbness in the back of the calf or sole of the foot. °· Burning sensations in the lower back, leg, or hip. °· Sharp pains in the lower back, leg, or hip. °· Leg weakness. °· Severe back pain inhibiting movement. °These symptoms may get worse with coughing, sneezing, laughing, or prolonged sitting or standing. Also, being overweight may worsen symptoms. °DIAGNOSIS  °Your caregiver will perform a physical exam to look for common symptoms of sciatica. He or she may ask you to do certain movements or activities that would trigger sciatic nerve pain. Other tests may be performed to find the cause of the sciatica. These may include: °· Blood tests. °· X-rays. °· Imaging tests, such as an MRI or CT scan. °TREATMENT  °Treatment is directed at the cause of the sciatic pain. Sometimes, treatment is not necessary  and the pain and discomfort goes away on its own. If treatment is needed, your caregiver may suggest: °· Over-the-counter medicines to relieve pain. °· Prescription medicines, such as anti-inflammatory medicine, muscle relaxants, or narcotics. °· Applying heat or ice to the painful area. °· Steroid injections to lessen pain, irritation, and inflammation around the nerve. °· Reducing activity during periods of pain. °· Exercising and stretching to strengthen your abdomen and improve flexibility of your spine. Your caregiver may suggest losing weight if the extra weight makes the back pain worse. °· Physical therapy. °· Surgery to eliminate what is pressing or pinching the nerve, such as a bone spur or part of a herniated disk. °HOME CARE INSTRUCTIONS  °· Only take over-the-counter or prescription medicines for pain or discomfort as directed by your caregiver. °· Apply ice to the affected area for 20 minutes, 3 4 times a day for the first 48 72 hours. Then try heat in the same way. °· Exercise, stretch, or perform your usual activities if these do not aggravate your pain. °· Attend physical therapy sessions as directed by your caregiver. °· Keep all follow-up appointments as directed by your caregiver. °· Do not wear high heels or shoes that do not provide proper support. °· Check your mattress to see if it is too soft. A firm mattress may lessen your pain and discomfort. °SEEK IMMEDIATE MEDICAL CARE IF:  °· You lose control of your bowel or bladder (incontinence). °· You have increasing weakness in the lower back,   pelvis, buttocks, or legs. °· You have redness or swelling of your back. °· You have a burning sensation when you urinate. °· You have pain that gets worse when you lie down or awakens you at night. °· Your pain is worse than you have experienced in the past. °· Your pain is lasting longer than 4 weeks. °· You are suddenly losing weight without reason. °MAKE SURE YOU: °· Understand these  instructions. °· Will watch your condition. °· Will get help right away if you are not doing well or get worse. °Document Released: 02/21/2001 Document Revised: 08/29/2011 Document Reviewed: 07/09/2011 °Exit[employer]® Patient Information ©2014 [employer]. ° °

## 2014-02-10 ENCOUNTER — Emergency Department (HOSPITAL_BASED_OUTPATIENT_CLINIC_OR_DEPARTMENT_OTHER): Payer: Medicaid Other

## 2014-02-10 ENCOUNTER — Emergency Department (HOSPITAL_BASED_OUTPATIENT_CLINIC_OR_DEPARTMENT_OTHER)
Admission: EM | Admit: 2014-02-10 | Discharge: 2014-02-10 | Disposition: A | Discharge status: Home / Self Care | Payer: Medicaid Other | Attending: Emergency Medicine | Admitting: Emergency Medicine

## 2014-02-10 ENCOUNTER — Encounter (HOSPITAL_BASED_OUTPATIENT_CLINIC_OR_DEPARTMENT_OTHER): Payer: Self-pay | Admitting: *Deleted

## 2014-02-10 DIAGNOSIS — I10 Essential (primary) hypertension: Secondary | ICD-10-CM | POA: Insufficient documentation

## 2014-02-10 DIAGNOSIS — Z79899 Other long term (current) drug therapy: Secondary | ICD-10-CM | POA: Diagnosis not present

## 2014-02-10 DIAGNOSIS — K219 Gastro-esophageal reflux disease without esophagitis: Secondary | ICD-10-CM | POA: Insufficient documentation

## 2014-02-10 DIAGNOSIS — Z86018 Personal history of other benign neoplasm: Secondary | ICD-10-CM | POA: Diagnosis not present

## 2014-02-10 DIAGNOSIS — M25569 Pain in unspecified knee: Secondary | ICD-10-CM

## 2014-02-10 DIAGNOSIS — M25561 Pain in right knee: Secondary | ICD-10-CM | POA: Diagnosis present

## 2014-02-10 DIAGNOSIS — M1990 Unspecified osteoarthritis, unspecified site: Secondary | ICD-10-CM | POA: Insufficient documentation

## 2014-02-10 DIAGNOSIS — F329 Major depressive disorder, single episode, unspecified: Secondary | ICD-10-CM | POA: Insufficient documentation

## 2014-02-10 DIAGNOSIS — Z87891 Personal history of nicotine dependence: Secondary | ICD-10-CM | POA: Diagnosis not present

## 2014-02-10 DIAGNOSIS — Z791 Long term (current) use of non-steroidal anti-inflammatories (NSAID): Secondary | ICD-10-CM | POA: Diagnosis not present

## 2014-02-10 MED ORDER — OXYCODONE-ACETAMINOPHEN 5-325 MG PO TABS: 1.0000 | ORAL_TABLET | ORAL | Status: DC | PRN

## 2014-02-10 MED ORDER — OXYCODONE-ACETAMINOPHEN 5-325 MG PO TABS
1.0000 | ORAL_TABLET | Freq: Once | ORAL | Status: AC
  Administered 2014-02-10: 1 via ORAL
  Filled 2014-02-10: qty 1

## 2014-02-10 NOTE — ED Provider Notes (Signed)
CSN: [id number]     Arrival date & time 02/10/14  1733 History  This chart was scribed for Debby Freiberg, MD by Tula Nakayama, ED Scribe. This patient was seen in room MH05/MH05 and the patient's care was started at 6:22 PM.    Chief Complaint  Patient presents with  . Knee Pain   The history is provided by the patient. No language interpreter was used.    HPI Comments: Jessica Walker is a 63 y.o. female with a history of arthritis who presents to the Emergency Department complaining of intermittent, moderate anterior right knee pain that radiates to the back of her knee and started 1 week ago. Pt states swelling, difficulty walking and muscle spasms in her right knee as associated symptoms. She notes pain becomes worse with walking and bending. She denies injury to the area and a history of gout. Pt also denies fever, nausea and vomiting as associated symptoms.  Past Medical History  Diagnosis Date  . Depression   . Hypertension   . GERD (gastroesophageal reflux disease)   . Adenoma of left adrenal gland   . Arthritis    Past Surgical History  Procedure Laterality Date  . Cholecystectomy N/A 02/08/2013    Procedure: LAPAROSCOPIC CHOLECYSTECTOMY WITH INTRAOPERATIVE CHOLANGIOGRAM;  Surgeon: Earnstine Regal, MD;  Location: [facility];  Service: General;  Laterality: N/A;  . Cesarean section     Family History  Problem Relation Age of Onset  . Sudden death Neg Hx   . Heart attack Neg Hx   . Hyperlipidemia Neg Hx   . Hypertension Neg Hx   . Diabetes Neg Hx    History  Substance Use Topics  . Smoking status: Former Smoker    Types: Cigarettes  . Smokeless tobacco: Not on file     Comment: 3 cigareets per day  . Alcohol Use: No   OB History    No data available     Review of Systems  Constitutional: Negative for fever.  Gastrointestinal: Negative for nausea and vomiting.  Musculoskeletal: Positive for joint swelling and arthralgias.  All other systems reviewed and are  negative.  Allergies  Aspirin and Eggs or egg-derived products  Home Medications   Prior to Admission medications   Medication Sig Start Date End Date Taking? Authorizing Provider  atenolol (TENORMIN) 50 MG tablet Take 50 mg by mouth 2 (two) times daily.      Historical Provider, MD  Calcium Carbonate-Vitamin D (CALCIUM + D PO) Take by mouth.    Historical Provider, MD  cyclobenzaprine (FLEXERIL) 5 MG tablet Take 1 tablet (5 mg total) by mouth 3 (three) times daily as needed for muscle spasms. 05/06/13   Dorie Rank, MD  gabapentin (NEURONTIN) 100 MG capsule Take 100 mg by mouth 2 (two) times daily.    Historical Provider, MD  hydrochlorothiazide (HYDRODIURIL) 25 MG tablet Take 12.5 mg by mouth daily.      Historical Provider, MD  HYDROcodone-acetaminophen (NORCO/VICODIN) 5-325 MG per tablet Take 1-2 tablets by mouth every 4 (four) hours as needed for moderate pain. 02/10/13   Earnstine Regal, PA-C  HYDROcodone-acetaminophen (NORCO/VICODIN) 5-325 MG per tablet Take 1-2 tablets by mouth every 4 (four) hours as needed. 05/06/13   Dorie Rank, MD  naproxen (NAPROSYN) 500 MG tablet Take 500 mg by mouth 2 (two) times daily with a meal.    Historical Provider, MD  omeprazole (PRILOSEC) 40 MG capsule Take 40 mg by mouth daily.    Historical Provider, MD  oxyCODONE-acetaminophen (  PERCOCET/ROXICET) 5-325 MG per tablet Take 1-2 tablets by mouth every 4 (four) hours as needed for severe pain. 02/10/14   Debby Freiberg, MD   BP 156/85 mmHg  Pulse 60  Temp(Src) 99.1 F (37.3 C) (Oral)  Resp 18  Ht 5\' 3"  (1.6 m)  Wt 160 lb (72.576 kg)  BMI 28.35 kg/m2  SpO2 98% Physical Exam  Constitutional: She is oriented to person, place, and time. She appears well-developed and well-nourished.  HENT:  Head: Normocephalic and atraumatic.  Right Ear: External ear normal.  Left Ear: External ear normal.  Eyes: Conjunctivae and EOM are normal. Pupils are equal, round, and reactive to light.  Neck: Normal range of  motion. Neck supple.  Cardiovascular: Normal rate, regular rhythm, normal heart sounds and intact distal pulses.   Pulmonary/Chest: Effort normal and breath sounds normal.  Abdominal: Soft. Bowel sounds are normal. There is no tenderness.  Musculoskeletal: Normal range of motion.       Right knee: She exhibits normal range of motion, no swelling and no erythema. Tenderness (with warmth) found.  Neurological: She is alert and oriented to person, place, and time.  Skin: Skin is warm and dry.  Vitals reviewed.   ED Course  Procedures (including critical care time) DIAGNOSTIC STUDIES: Oxygen Saturation is 97% on RA, normal by my interpretation.    COORDINATION OF CARE: 6:26 PM Discussed treatment plan with pt which includes a knee x-ray. Pt agreed to plan.  Labs Review Labs Reviewed - No data to display  Imaging Review Dg Knee Complete 4 Views Right  02/10/2014   CLINICAL DATA:  Acute right knee pain, swelling and popping sensation  EXAM: RIGHT KNEE - COMPLETE 4+ VIEW  COMPARISON:  12/23/2010  FINDINGS: Normal alignment without acute fracture. Preserved joint spaces. No significant arthropathy. Question small effusion on the lateral view when compared to the prior study.  IMPRESSION: Possible small effusion.  No acute osseous finding   Electronically Signed   By: Daryll Brod M.D.   On: 02/10/2014 18:44     EKG Interpretation None      MDM   Final diagnoses:  Knee pain    63 y.o. female with pertinent PMH of OA presents with recurrent arthritis, this time in R knee.  Joint warm and tender, but FROM and no erythema.    XR unremarkable.  Pt able to ambulate.  Not a significant enough effusion to warrant arthrocentesis at this time.  Doubt septic arthritis, likely recurrent OA.  Pt given strict return precautions, voiced understanding, and agreed to fu.  1. Knee pain          Debby Freiberg, MD 02/12/14 [phone]

## 2014-02-10 NOTE — Discharge Instructions (Signed)
Arthritis, Nonspecific °Arthritis is inflammation of a joint. This usually means pain, redness, warmth or swelling are present. One or more joints may be involved. There are a number of types of arthritis. Your caregiver may not be able to tell what type of arthritis you have right away. °CAUSES  °The most common cause of arthritis is the wear and tear on the joint (osteoarthritis). This causes damage to the cartilage, which can break down over time. The knees, hips, back and neck are most often affected by this type of arthritis. °Other types of arthritis and common causes of joint pain include: °· Sprains and other injuries near the joint. Sometimes minor sprains and injuries cause pain and swelling that develop hours later. °· Rheumatoid arthritis. This affects hands, feet and knees. It usually affects both sides of your body at the same time. It is often associated with chronic ailments, fever, weight loss and general weakness. °· Crystal arthritis. Gout and pseudo gout can cause occasional acute severe pain, redness and swelling in the foot, ankle, or knee. °· Infectious arthritis. Bacteria can get into a joint through a break in overlying skin. This can cause infection of the joint. Bacteria and viruses can also spread through the blood and affect your joints. °· Drug, infectious and allergy reactions. Sometimes joints can become mildly painful and slightly swollen with these types of illnesses. °SYMPTOMS  °· Pain is the main symptom. °· Your joint or joints can also be red, swollen and warm or hot to the touch. °· You may have a fever with certain types of arthritis, or even feel overall ill. °· The joint with arthritis will hurt with movement. Stiffness is present with some types of arthritis. °DIAGNOSIS  °Your caregiver will suspect arthritis based on your description of your symptoms and on your exam. Testing may be needed to find the type of arthritis: °· Blood and sometimes urine tests. °· X-ray tests  and sometimes CT or MRI scans. °· Removal of fluid from the joint (arthrocentesis) is done to check for bacteria, crystals or other causes. Your caregiver (or a specialist) will numb the area over the joint with a local anesthetic, and use a needle to remove joint fluid for examination. This procedure is only minimally uncomfortable. °· Even with these tests, your caregiver may not be able to tell what kind of arthritis you have. Consultation with a specialist (rheumatologist) may be helpful. °TREATMENT  °Your caregiver will discuss with you treatment specific to your type of arthritis. If the specific type cannot be determined, then the following general recommendations may apply. °Treatment of severe joint pain includes: °· Rest. °· Elevation. °· Anti-inflammatory medication (for example, ibuprofen) may be prescribed. Avoiding activities that cause increased pain. °· Only take over-the-counter or prescription medicines for pain and discomfort as recommended by your caregiver. °· Cold packs over an inflamed joint may be used for 10 to 15 minutes every hour. Hot packs sometimes feel better, but do not use overnight. Do not use hot packs if you are diabetic without your caregiver's permission. °· A cortisone shot into arthritic joints may help reduce pain and swelling. °· Any acute arthritis that gets worse over the next 1 to 2 days needs to be looked at to be sure there is no joint infection. °Long-term arthritis treatment involves modifying activities and lifestyle to reduce joint stress jarring. This can include weight loss. Also, exercise is needed to nourish the joint cartilage and remove waste. This helps keep the muscles   around the joint strong. °HOME CARE INSTRUCTIONS  °· Do not take aspirin to relieve pain if gout is suspected. This elevates uric acid levels. °· Only take over-the-counter or prescription medicines for pain, discomfort or fever as directed by your caregiver. °· Rest the joint as much as  possible. °· If your joint is swollen, keep it elevated. °· Use crutches if the painful joint is in your leg. °· Drinking plenty of fluids may help for certain types of arthritis. °· Follow your caregiver's dietary instructions. °· Try low-impact exercise such as: °¨ Swimming. °¨ Water aerobics. °¨ Biking. °¨ Walking. °· Morning stiffness is often relieved by a warm shower. °· Put your joints through regular range-of-motion. °SEEK MEDICAL CARE IF:  °· You do not feel better in 24 hours or are getting worse. °· You have side effects to medications, or are not getting better with treatment. °SEEK IMMEDIATE MEDICAL CARE IF:  °· You have a fever. °· You develop severe joint pain, swelling or redness. °· Many joints are involved and become painful and swollen. °· There is severe back pain and/or leg weakness. °· You have loss of bowel or bladder control. °Document Released: 04/06/2004 Document Revised: 05/22/2011 Document Reviewed: 04/22/2008 °[employer]® Patient Information ©2015 [employer]. This information is not intended to replace advice given to you by your health care provider. Make sure you discuss any questions you have with your health care provider. ° °

## 2014-02-10 NOTE — ED Notes (Addendum)
Pt c/o right knee pain w/o injury x 1 week

## 2014-03-03 ENCOUNTER — Encounter (HOSPITAL_BASED_OUTPATIENT_CLINIC_OR_DEPARTMENT_OTHER): Payer: Self-pay

## 2014-03-03 ENCOUNTER — Emergency Department (HOSPITAL_BASED_OUTPATIENT_CLINIC_OR_DEPARTMENT_OTHER)
Admission: EM | Admit: 2014-03-03 | Discharge: 2014-03-03 | Disposition: A | Discharge status: Home / Self Care | Payer: Medicaid Other | Attending: Emergency Medicine | Admitting: Emergency Medicine

## 2014-03-03 DIAGNOSIS — K219 Gastro-esophageal reflux disease without esophagitis: Secondary | ICD-10-CM | POA: Insufficient documentation

## 2014-03-03 DIAGNOSIS — Z720 Tobacco use: Secondary | ICD-10-CM | POA: Diagnosis not present

## 2014-03-03 DIAGNOSIS — M25511 Pain in right shoulder: Secondary | ICD-10-CM | POA: Insufficient documentation

## 2014-03-03 DIAGNOSIS — I10 Essential (primary) hypertension: Secondary | ICD-10-CM | POA: Diagnosis not present

## 2014-03-03 DIAGNOSIS — M1990 Unspecified osteoarthritis, unspecified site: Secondary | ICD-10-CM | POA: Diagnosis not present

## 2014-03-03 DIAGNOSIS — M25561 Pain in right knee: Secondary | ICD-10-CM | POA: Diagnosis not present

## 2014-03-03 DIAGNOSIS — Z791 Long term (current) use of non-steroidal anti-inflammatories (NSAID): Secondary | ICD-10-CM | POA: Insufficient documentation

## 2014-03-03 DIAGNOSIS — M2550 Pain in unspecified joint: Secondary | ICD-10-CM

## 2014-03-03 DIAGNOSIS — M25512 Pain in left shoulder: Secondary | ICD-10-CM | POA: Diagnosis not present

## 2014-03-03 DIAGNOSIS — G8929 Other chronic pain: Secondary | ICD-10-CM | POA: Insufficient documentation

## 2014-03-03 DIAGNOSIS — F329 Major depressive disorder, single episode, unspecified: Secondary | ICD-10-CM | POA: Insufficient documentation

## 2014-03-03 DIAGNOSIS — Z79899 Other long term (current) drug therapy: Secondary | ICD-10-CM | POA: Insufficient documentation

## 2014-03-03 MED ORDER — OXYCODONE-ACETAMINOPHEN 5-325 MG PO TABS
1.0000 | ORAL_TABLET | Freq: Four times a day (QID) | ORAL | Status: DC | PRN
Start: 2014-03-03 — End: 2014-07-05

## 2014-03-03 MED ORDER — PREDNISONE 10 MG PO TABS: 40.0000 mg | ORAL_TABLET | Freq: Every day | ORAL | Status: DC

## 2014-03-03 NOTE — Discharge Instructions (Signed)

## 2014-03-03 NOTE — ED Notes (Signed)
Pt reports pain and muscle spasms in arms and shoulders. Sts hx of chronic pain with pain patches on. Pt seen on 12/1, dx with arthritis, given Percocet, wanted to get more filled, went to pharmacy and was advised to check in because she couldn't get a refill. Also reports cough

## 2014-03-03 NOTE — ED Provider Notes (Signed)
CSN: [id number]     Arrival date & time 03/03/14  1500 History   First MD Initiated Contact with Patient 03/03/14 1515     Chief Complaint  Patient presents with  . Extremity Pain  . Cough     Patient is a 63 y.o. female presenting with extremity pain and cough. The history is provided by the patient. No language interpreter was used.  Extremity Pain  Cough  Patient here for evaluation of joint pain. Her joint pain as been ongoing for the last several months. She is hurting in her bilateral shoulders, right knee. Pain is throbbing in nature it is worse in the mornings and improves throughout the day. She has chronic pain of the right knee and difficulty with walking at baseline and uses a cane for ambulation. She states that her knee gives out often. 3 days ago her knee gave out and she fell backwards with her arms outstretched. She has been having increased pain in both shoulders and right knee since that fall. He fevers, vomiting, abdominal pain, dysuria. She states that her doctor is currently switching offices and she will not be able to be seen until January. She put lidocaine patches on her shoulders and knee and states she does have some improvement with these patches. Symptoms are moderate, waxing and waning, worsening. Pain in shoulders and knee are worse with range of motion.  Past Medical History  Diagnosis Date  . Depression   . Hypertension   . GERD (gastroesophageal reflux disease)   . Adenoma of left adrenal gland   . Arthritis    Past Surgical History  Procedure Laterality Date  . Cholecystectomy N/A 02/08/2013    Procedure: LAPAROSCOPIC CHOLECYSTECTOMY WITH INTRAOPERATIVE CHOLANGIOGRAM;  Surgeon: Earnstine Regal, MD;  Location: [facility];  Service: General;  Laterality: N/A;  . Cesarean section     Family History  Problem Relation Age of Onset  . Sudden death Neg Hx   . Heart attack Neg Hx   . Hyperlipidemia Neg Hx   . Hypertension Neg Hx   . Diabetes Neg Hx     History  Substance Use Topics  . Smoking status: Former Smoker    Types: Cigarettes  . Smokeless tobacco: Not on file     Comment: 3 cigareets per day  . Alcohol Use: No   OB History    No data available     Review of Systems  Respiratory: Positive for cough.   All other systems reviewed and are negative.     Allergies  Aspirin and Eggs or egg-derived products  Home Medications   Prior to Admission medications   Medication Sig Start Date End Date Taking? Authorizing Provider  atenolol (TENORMIN) 50 MG tablet Take 50 mg by mouth 2 (two) times daily.      Historical Provider, MD  Calcium Carbonate-Vitamin D (CALCIUM + D PO) Take by mouth.    Historical Provider, MD  cyclobenzaprine (FLEXERIL) 5 MG tablet Take 1 tablet (5 mg total) by mouth 3 (three) times daily as needed for muscle spasms. 05/06/13   Dorie Rank, MD  gabapentin (NEURONTIN) 100 MG capsule Take 100 mg by mouth 2 (two) times daily.    Historical Provider, MD  hydrochlorothiazide (HYDRODIURIL) 25 MG tablet Take 12.5 mg by mouth daily.      Historical Provider, MD  HYDROcodone-acetaminophen (NORCO/VICODIN) 5-325 MG per tablet Take 1-2 tablets by mouth every 4 (four) hours as needed for moderate pain. 02/10/13   Earnstine Regal, PA-C  HYDROcodone-acetaminophen (NORCO/VICODIN) 5-325 MG per tablet Take 1-2 tablets by mouth every 4 (four) hours as needed. 05/06/13   Dorie Rank, MD  naproxen (NAPROSYN) 500 MG tablet Take 500 mg by mouth 2 (two) times daily with a meal.    Historical Provider, MD  omeprazole (PRILOSEC) 40 MG capsule Take 40 mg by mouth daily.    Historical Provider, MD  oxyCODONE-acetaminophen (PERCOCET/ROXICET) 5-325 MG per tablet Take 1-2 tablets by mouth every 4 (four) hours as needed for severe pain. 02/10/14   Debby Freiberg, MD   BP 153/85 mmHg  Pulse 64  Temp(Src) 98.5 F (36.9 C) (Oral)  Resp 16  Ht 5\' 3"  (1.6 m)  Wt 160 lb (72.576 kg)  BMI 28.35 kg/m2  SpO2 99% Physical Exam  Constitutional:  She is oriented to person, place, and time. She appears well-developed and well-nourished. No distress.  HENT:  Head: Normocephalic and atraumatic.  Cardiovascular: Normal rate, regular rhythm and normal heart sounds.   No murmur heard. Pulmonary/Chest: Effort normal and breath sounds normal. No respiratory distress.  Abdominal: Soft. There is no tenderness. There is no rebound and no guarding.  Musculoskeletal:  Mild tenderness on bilateral shoulders with preserved range of motion. 2+ radial pulses bilaterally. There is mild diffuse anterior knee tenderness with effusion present. There is no overlying erythema. Patient has increased pain with range of motion of the knee but she is able to range the knee.  Neurological: She is alert and oriented to person, place, and time.  Skin: Skin is warm and dry.  Psychiatric: She has a normal mood and affect. Her behavior is normal.  Nursing note and vitals reviewed.   ED Course  Procedures (including critical care time) Labs Review Labs Reviewed - No data to display  Imaging Review No results found.   EKG Interpretation None      MDM   Final diagnoses:  Arthralgia    Patient here with complaints of multiple joint pain that has been ongoing for several months. There is no evidence of acute infectious process. History and presentation is not consistent with acute fracture or dislocation, do not feel radiographs are warranted at this time. Discussed with patient importance of PCP follow-up, anti-inflammatories at home with over-the-counter Aleve. We'll try a short course of prednisone to evaluate for relief in symptoms. Providing very small prescription for Percocet, but instructed patient that the emergency department is not the proper place for her to go for chronic pain.    Quintella Reichert, MD 03/03/14 [phone]

## 2014-07-05 ENCOUNTER — Emergency Department (HOSPITAL_BASED_OUTPATIENT_CLINIC_OR_DEPARTMENT_OTHER)
Admission: EM | Admit: 2014-07-05 | Discharge: 2014-07-05 | Disposition: A | Discharge status: Home / Self Care | Payer: Medicaid Other | Attending: Emergency Medicine | Admitting: Emergency Medicine

## 2014-07-05 ENCOUNTER — Encounter (HOSPITAL_BASED_OUTPATIENT_CLINIC_OR_DEPARTMENT_OTHER): Payer: Self-pay | Admitting: *Deleted

## 2014-07-05 DIAGNOSIS — Z85858 Personal history of malignant neoplasm of other endocrine glands: Secondary | ICD-10-CM | POA: Insufficient documentation

## 2014-07-05 DIAGNOSIS — M25561 Pain in right knee: Secondary | ICD-10-CM | POA: Insufficient documentation

## 2014-07-05 DIAGNOSIS — M1990 Unspecified osteoarthritis, unspecified site: Secondary | ICD-10-CM | POA: Insufficient documentation

## 2014-07-05 DIAGNOSIS — Z8659 Personal history of other mental and behavioral disorders: Secondary | ICD-10-CM | POA: Diagnosis not present

## 2014-07-05 DIAGNOSIS — I10 Essential (primary) hypertension: Secondary | ICD-10-CM | POA: Insufficient documentation

## 2014-07-05 DIAGNOSIS — Z7952 Long term (current) use of systemic steroids: Secondary | ICD-10-CM | POA: Insufficient documentation

## 2014-07-05 DIAGNOSIS — M79642 Pain in left hand: Secondary | ICD-10-CM | POA: Insufficient documentation

## 2014-07-05 DIAGNOSIS — K219 Gastro-esophageal reflux disease without esophagitis: Secondary | ICD-10-CM | POA: Diagnosis not present

## 2014-07-05 DIAGNOSIS — M791 Myalgia: Secondary | ICD-10-CM | POA: Diagnosis present

## 2014-07-05 DIAGNOSIS — Z87891 Personal history of nicotine dependence: Secondary | ICD-10-CM | POA: Diagnosis not present

## 2014-07-05 DIAGNOSIS — M79641 Pain in right hand: Secondary | ICD-10-CM | POA: Insufficient documentation

## 2014-07-05 DIAGNOSIS — L259 Unspecified contact dermatitis, unspecified cause: Secondary | ICD-10-CM | POA: Diagnosis not present

## 2014-07-05 DIAGNOSIS — M2550 Pain in unspecified joint: Secondary | ICD-10-CM

## 2014-07-05 DIAGNOSIS — Z79899 Other long term (current) drug therapy: Secondary | ICD-10-CM | POA: Diagnosis not present

## 2014-07-05 MED ORDER — CYCLOBENZAPRINE HCL 10 MG PO TABS: 10.0000 mg | ORAL_TABLET | Freq: Two times a day (BID) | ORAL | Status: AC | PRN

## 2014-07-05 MED ORDER — PREDNISONE 20 MG PO TABS: 40.0000 mg | ORAL_TABLET | Freq: Every day | ORAL | Status: DC

## 2014-07-05 MED ORDER — PREDNISONE 50 MG PO TABS: 60.0000 mg | ORAL_TABLET | Freq: Once | ORAL | Status: DC

## 2014-07-05 MED ORDER — OXYCODONE-ACETAMINOPHEN 5-325 MG PO TABS: 1.0000 | ORAL_TABLET | Freq: Once | ORAL | Status: DC

## 2014-07-05 MED ORDER — OXYCODONE-ACETAMINOPHEN 5-325 MG PO TABS: 1.0000 | ORAL_TABLET | Freq: Four times a day (QID) | ORAL | Status: DC | PRN

## 2014-07-05 MED ORDER — HYDROXYZINE HCL 25 MG PO TABS: 25.0000 mg | ORAL_TABLET | Freq: Four times a day (QID) | ORAL | Status: DC

## 2014-07-05 NOTE — ED Provider Notes (Signed)
CSN: [id number]     Arrival date & time 07/05/14  1359 History  This chart was scribed for Blanchie Dessert, MD by Stephania Fragmin, ED Scribe. This patient was seen in room MHT13/MHT13 and the patient's care was started at 5:28 PM.    Chief Complaint  Patient presents with  . Generalized Body Aches   The history is provided by the patient. No language interpreter was used.     HPI Comments: Jessica Walker is a 64 y.o. female with a history of arthritis, hypertension, depression, and GERD who presents to the Emergency Department complaining of itching red, rashes over her face, scalp, and ears that began 2 days ago after she dyed her hair 3 days ago.   Patient has a history of arthritis and also complains of muscle spasms in her fingers and shoulders, as well as swelling in her right knee.She is unsure what type of arthritis this is, as she is still due to be evaluated by a specialist. Of all the medications she is currently taking, oxycodone has provided the most relief. She has taken Tylenol Arthritis and cyclobenzaprine at night with minimal relief. She denies any fever. Patient has taken prednisone in the past for her arthritis. She had surgery on her left arm 4 days ago.   Past Medical History  Diagnosis Date  . Depression   . Hypertension   . GERD (gastroesophageal reflux disease)   . Adenoma of left adrenal gland   . Arthritis    Past Surgical History  Procedure Laterality Date  . Cholecystectomy N/A 02/08/2013    Procedure: LAPAROSCOPIC CHOLECYSTECTOMY WITH INTRAOPERATIVE CHOLANGIOGRAM;  Surgeon: Earnstine Regal, MD;  Location: [facility];  Service: General;  Laterality: N/A;  . Cesarean section     Family History  Problem Relation Age of Onset  . Sudden death Neg Hx   . Heart attack Neg Hx   . Hyperlipidemia Neg Hx   . Hypertension Neg Hx   . Diabetes Neg Hx    History  Substance Use Topics  . Smoking status: Former Smoker    Types: Cigarettes  . Smokeless tobacco: Not on file      Comment: 3 cigareets per day  . Alcohol Use: No   OB History    No data available     Review of Systems  A complete 10 system review of systems was obtained and all systems are negative except as noted in the HPI and PMH.    Allergies  Aspirin and Eggs or egg-derived products  Home Medications   Prior to Admission medications   Medication Sig Start Date End Date Taking? Authorizing Provider  atenolol (TENORMIN) 50 MG tablet Take 50 mg by mouth 2 (two) times daily.      Historical Provider, MD  Calcium Carbonate-Vitamin D (CALCIUM + D PO) Take by mouth.    Historical Provider, MD  gabapentin (NEURONTIN) 100 MG capsule Take 100 mg by mouth 2 (two) times daily.    Historical Provider, MD  hydrochlorothiazide (HYDRODIURIL) 25 MG tablet Take 12.5 mg by mouth daily.      Historical Provider, MD  omeprazole (PRILOSEC) 40 MG capsule Take 40 mg by mouth daily.    Historical Provider, MD  oxyCODONE-acetaminophen (PERCOCET/ROXICET) 5-325 MG per tablet Take 1 tablet by mouth every 6 (six) hours as needed for moderate pain or severe pain. 03/03/14   Quintella Reichert, MD  predniSONE (DELTASONE) 10 MG tablet Take 4 tablets (40 mg total) by mouth daily. 03/03/14  Quintella Reichert, MD   BP 143/92 mmHg  Pulse 74  Temp(Src) 98 F (36.7 C) (Oral)  Resp 20  Ht 5\' 3"  (1.6 m)  Wt 165 lb (74.844 kg)  BMI 29.24 kg/m2  SpO2 98% Physical Exam  Constitutional: She is oriented to person, place, and time. She appears well-developed and well-nourished. No distress.  HENT:  Head: Normocephalic and atraumatic.  Eyes: Conjunctivae and EOM are normal.  Neck: Neck supple. No tracheal deviation present.  Cardiovascular: Normal rate.   Pulmonary/Chest: Effort normal. No respiratory distress.  Musculoskeletal: Normal range of motion.  Multiple tenderness over the joints of the PIP and MCP joints of bilateral hands. Right knee tenderness, swelling, and pain with ROM.  Neurological: She is alert and oriented to  person, place, and time.  Skin: Skin is warm and dry.  Erythematous, maculopapular, non-crusting, excoriated rash involving the entire scalp, borders of forehead, and tips of ears. No drainage or vesicles.   Psychiatric: She has a normal mood and affect. Her behavior is normal.  Nursing note and vitals reviewed.   ED Course  Procedures (including critical care time)  DIAGNOSTIC STUDIES: Oxygen Saturation is 98% on room air, normal by my interpretation.    COORDINATION OF CARE: 5:37 PM - Discussed treatment plan with pt at bedside, and pt agreed to plan.  MDM   Final diagnoses:  Contact dermatitis  Joint pain    Patient with evidence of contact dermatitis to scalp after using a hair dye. She was started on prednisone, Atarax. She also has a history of arthritis and is currently getting follow-up at [facility] this week for further evaluation for arthritis. No evidence of septic joint at this time. Patient given pain control. I personally performed the services described in this documentation, which was scribed in my presence.  The recorded information has been reviewed and considered.     Blanchie Dessert, MD 07/06/14 [phone]

## 2014-07-05 NOTE — Discharge Instructions (Signed)

## 2014-07-05 NOTE — ED Notes (Signed)
Pt is here for chronic muscle spasms all over her body.  Pt also has had a allergic reaction to a hair coloring (redness in area where color was).  Pt states that the spasms are related to arthritis

## 2015-04-01 ENCOUNTER — Ambulatory Visit (INDEPENDENT_AMBULATORY_CARE_PROVIDER_SITE_OTHER): Payer: Medicaid Other | Admitting: Podiatry

## 2015-04-01 ENCOUNTER — Other Ambulatory Visit: Payer: Self-pay

## 2015-04-01 ENCOUNTER — Encounter: Payer: Self-pay | Admitting: Podiatry

## 2015-04-01 VITALS — BP 142/77 | HR 71 | Ht 62.0 in | Wt 177.0 lb

## 2015-04-01 DIAGNOSIS — M79673 Pain in unspecified foot: Secondary | ICD-10-CM

## 2015-04-01 DIAGNOSIS — M2010 Hallux valgus (acquired), unspecified foot: Secondary | ICD-10-CM | POA: Diagnosis not present

## 2015-04-01 DIAGNOSIS — L600 Ingrowing nail: Secondary | ICD-10-CM

## 2015-04-01 DIAGNOSIS — M79606 Pain in leg, unspecified: Secondary | ICD-10-CM | POA: Insufficient documentation

## 2015-04-01 DIAGNOSIS — M21619 Bunion of unspecified foot: Secondary | ICD-10-CM | POA: Insufficient documentation

## 2015-04-01 NOTE — Progress Notes (Signed)
SUBJECTIVE: 65 y.o. year old female presents for diabetic foot care. Patient is ambulatory without} assistance. Patient is referred by Dr. Jarold Song.  Feet aches, sore especially on big toe and bunion area x 3-4 months. Been soaking, using alcohol and different things at home without any improvement. Having difficulty wearing shoes.  Patient is not employed.  REVIEW OF SYSTEMS: Constitutional: negative Eyes: negative Ears, nose, mouth, throat, and face: negative Respiratory: negative Cardiovascular: negative Gastrointestinal: negative Genitourinary:negative Integument/breast: negative Musculoskeletal:Occasional muscle spasm on both upper arms. Neurological: Takes antidepressant medication. Endocrine: negative Allergic/Immunologic: Aspirin bothers her stomach.  OBJECTIVE: DERMATOLOGIC EXAMINATION: Nails: thick ingrown and painful both great toe both borders. No abnormal skin lesions.   VASCULAR EXAMINATION OF LOWER LIMBS: Pedal pulses: All pedal pulses are palpable with normal pulsation.  Capillary Filling times within 3 seconds in all digits.  No edema or ischemic changes noted. Temperature gradient from tibial crest to dorsum of foot is within normal bilateral.  NEUROLOGIC EXAMINATION OF THE LOWER LIMBS: Achilles DTR is present and within normal. Monofilament (Semmes-Weinstein 10-gm) sensory testing positive 6 out of 6, bilateral. Vibratory sensations(128Hz  turning fork) intact at medial and lateral forefoot bilateral.  Sharp and Dull discriminatory sensations at the plantar ball of hallux is intact bilateral.   MUSCULOSKELETAL EXAMINATION: Positive for Hallux valgus bilateral. Positive for bunion deformity bilateral.   RADIOGRAPHIC STUDIES:  General overview: Negative of Osteopenia Absence of soft tissue swelling. Absence of acute changes in osseous or articular surfaces of forefoot or rearfoot.  AP View:  Increased first intermetatarsal angle bilateral: Positive of  Hallux valgus bilateral.  Fibular Sesamoid position 6 on right, 4 on left. First metatarsal length -3 right, -2 on left. Lateral deviation of the Calcaneo-cuboid joint: Talonavicular articulation surface: 5th metatarsal lateral deviation: Lateral view:  Pronated foot with elevated first ray bilateral. Posterior calcaneal spurs present bilateral. Positive of CYMA line anterior break.   ASSESSMENT: HAV bilateral.  Painful bunion R>L. Ingrown nails both great toes.  PLAN: Reviewed clinical findings and available treatment options. As per request, surgery consent form reviewed for bunionectomy([place]) right foot.

## 2015-04-01 NOTE — Patient Instructions (Signed)
Seen for painful bunions R>L.  As per request surgery consent form reviewed for bunionectomy right foot.

## 2015-04-08 ENCOUNTER — Other Ambulatory Visit: Payer: Self-pay | Admitting: Podiatry

## 2015-04-08 MED ORDER — HYDROCODONE-ACETAMINOPHEN 10-325 MG PO TABS: 1.0000 | ORAL_TABLET | Freq: Three times a day (TID) | ORAL | Status: DC | PRN

## 2015-04-09 ENCOUNTER — Encounter: Payer: Self-pay | Admitting: *Deleted

## 2015-04-09 DIAGNOSIS — M2010 Hallux valgus (acquired), unspecified foot: Secondary | ICD-10-CM | POA: Diagnosis not present

## 2015-04-09 HISTORY — PX: OTHER SURGICAL HISTORY: SHX169

## 2015-04-15 ENCOUNTER — Ambulatory Visit (INDEPENDENT_AMBULATORY_CARE_PROVIDER_SITE_OTHER): Payer: Medicaid Other | Admitting: Podiatry

## 2015-04-15 ENCOUNTER — Encounter: Payer: Self-pay | Admitting: Podiatry

## 2015-04-15 DIAGNOSIS — Z9889 Other specified postprocedural states: Secondary | ICD-10-CM

## 2015-04-15 NOTE — Patient Instructions (Signed)
5 day post op wound healing well. Continue light activity. Return in one week.

## 2015-04-15 NOTE — Progress Notes (Signed)
5 day post op right [place] bunionectomy right. (04/09/15) Denies any discomfort.  Got her bandage wet and redressed herself. Wound is clean and dry. Well coapted. Surgical wound cleansed with Iodine and DSD reapplied. Return in one week.

## 2015-04-22 ENCOUNTER — Ambulatory Visit (INDEPENDENT_AMBULATORY_CARE_PROVIDER_SITE_OTHER): Payer: Medicaid Other | Admitting: Podiatry

## 2015-04-22 ENCOUNTER — Encounter: Payer: Self-pay | Admitting: Podiatry

## 2015-04-22 ENCOUNTER — Other Ambulatory Visit: Payer: Self-pay

## 2015-04-22 DIAGNOSIS — M21619 Bunion of unspecified foot: Secondary | ICD-10-CM

## 2015-04-22 DIAGNOSIS — M2010 Hallux valgus (acquired), unspecified foot: Secondary | ICD-10-CM | POA: Diagnosis not present

## 2015-04-22 DIAGNOSIS — Z9889 Other specified postprocedural states: Secondary | ICD-10-CM

## 2015-04-22 NOTE — Patient Instructions (Addendum)
2 weeks post op visit. Doing well without complication. Sutures removed. Ok to get wet. Post op X-ray show normal progress. Return in 2 weeks.

## 2015-04-22 NOTE — Progress Notes (Signed)
Patient ID: Jessica Walker, female   DOB: 07-Oct-1950, 65 y.o.   MRN: [id number]  2 weeks post op [place] bunionectomy right. (04/09/15) Denies any discomfort. Been around with surgical shoe and gets some stinging feeling at times.   Wound is clean and dry. Well coapted. Surgical wound cleansed with Iodine and sutures removed.  Radiographic examination: 2 weeks Post op x-ray show post surgical removed medial eminence of the first metatarsal head with internal fixation screw in place in metatarsal head right foot. No bony or hardware displacement noted. Normal post op x-ray without complication noted.  Return in two weeks.

## 2015-05-06 ENCOUNTER — Ambulatory Visit (INDEPENDENT_AMBULATORY_CARE_PROVIDER_SITE_OTHER): Payer: Medicaid Other | Admitting: Podiatry

## 2015-05-06 ENCOUNTER — Encounter: Payer: Self-pay | Admitting: Podiatry

## 2015-05-06 DIAGNOSIS — Z9889 Other specified postprocedural states: Secondary | ICD-10-CM

## 2015-05-06 MED ORDER — HYDROCODONE-ACETAMINOPHEN 10-325 MG PO TABS: 1.0000 | ORAL_TABLET | Freq: Three times a day (TID) | ORAL | Status: DC | PRN

## 2015-05-06 NOTE — Progress Notes (Signed)
4 weeks post op [place] bunionectomy right. (04/09/15) The right foot is stinging and does not have all the feelings.  She has done some cleaning and been on feet.  Right foot has swelling Incision site healed well.  No abnormal finding other than edema following the [facility] bunionectomy right.  Right foot placed in Coban wrap with instruction to stay off. Return in 2 weeks.

## 2015-05-06 NOTE — Patient Instructions (Signed)
Having pain and swelling on right foot after been doing extra work. Compression bandage and Coban wrap placed. Stay off of feet, rest, and elevate. Return in 2 weeks.

## 2015-05-20 ENCOUNTER — Encounter: Payer: Self-pay | Admitting: Podiatry

## 2015-05-20 ENCOUNTER — Ambulatory Visit (INDEPENDENT_AMBULATORY_CARE_PROVIDER_SITE_OTHER): Payer: Medicaid Other | Admitting: Podiatry

## 2015-05-20 VITALS — BP 128/73 | HR 58

## 2015-05-20 DIAGNOSIS — Z9889 Other specified postprocedural states: Secondary | ICD-10-CM

## 2015-05-20 NOTE — Progress Notes (Signed)
6 weeks post op [place] bunionectomy right. (04/09/15) Decreased swelling and pain on right foot since last visit.  Incision site healed well.  Normal healing with minimum swelling following the [facility] bunionectomy right.  Right foot placed in compression stockinet and instructed to stay off of feet.  Return in 3 weeks.

## 2015-05-20 NOTE — Patient Instructions (Addendum)
6 weeks post op following bunion surgery right foot. Swelling has decreased. Stay in surgical shoe for another 2 weeks and follow with Tennis shoes if tolerated. Return in 3 weeks. We will discuss left foot bunion on next visit.

## 2015-06-09 ENCOUNTER — Other Ambulatory Visit: Payer: Self-pay

## 2015-06-09 ENCOUNTER — Ambulatory Visit (INDEPENDENT_AMBULATORY_CARE_PROVIDER_SITE_OTHER): Payer: Medicaid Other | Admitting: Podiatry

## 2015-06-09 ENCOUNTER — Encounter: Payer: Self-pay | Admitting: Podiatry

## 2015-06-09 DIAGNOSIS — M2010 Hallux valgus (acquired), unspecified foot: Secondary | ICD-10-CM

## 2015-06-09 DIAGNOSIS — M79606 Pain in leg, unspecified: Secondary | ICD-10-CM

## 2015-06-09 DIAGNOSIS — M79671 Pain in right foot: Secondary | ICD-10-CM | POA: Diagnosis not present

## 2015-06-09 DIAGNOSIS — M2011 Hallux valgus (acquired), right foot: Secondary | ICD-10-CM

## 2015-06-09 DIAGNOSIS — M21611 Bunion of right foot: Secondary | ICD-10-CM

## 2015-06-09 DIAGNOSIS — M21619 Bunion of unspecified foot: Secondary | ICD-10-CM

## 2015-06-09 MED ORDER — HYDROCODONE-ACETAMINOPHEN 10-325 MG PO TABS: 1.0000 | ORAL_TABLET | Freq: Three times a day (TID) | ORAL | Status: DC | PRN

## 2015-06-09 NOTE — Progress Notes (Signed)
Subjective:  2 months post op [place] bunionectomy right. (04/09/15) 65 year old female presents aided by a walker stating that she is not doing well. Patient request x-ray be taken on her right foot.   Patient relates following history: She went on a weekend trip to [place] and [facility] visiting museum past Friday morning. Then on Saturday morning right hip, knee, and foot pain started, but went on her sight seeing anyway, which required of lot of walking.  That night she soaked her foot and laid down. On Sunday morning her foot and leg were hurting too much and could not get up on her own.  Her and her two daughters were returning from the trip on Sunday afternoon.  She had to have help to go to bathroom. Her right lower limb gave out and fell. She was picked up by Paramedic and checked in ER while still in [place]. They took X-ray and examined. 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 She was seen by her PCP yesterday, Tuesday back in [place].  Stated that her right hip hurts and gives out at times while standing. Her knee feels cold and numb while on her feet.  Stated that blood clot was ruled out at her PCP office.  On her last visit to this office her swelling was down and her foot was in normal appearance. Today her right foot is swollen again.  Objective: On right foot, S/P 2 month [place] bunionectomy post-op X-ray finding is normal with well approximated osteotomy site. Screw in place. The first metatarsal bone is in corrected position.  No displacement of bone or internal fixation screw in surgical site. Findings consistent with surgery performed.   Incision site healed well.  Recurrent post op edema right foot following a trip to [place].  Assessment: Neurologic symptoms with pain and numbness on right hip and lower limb. Normal bone healing post op bunion site right foot. Post op edema right foot.   Plan: Reviewed findings and explained the nature of her right lower  limb pain.  Right foot placed in compression stockinet and instructed to stay off of feet.  Return in one month.

## 2015-06-09 NOTE — Patient Instructions (Signed)
2 month post op right foot from bunion surgery. Normal bone healing. Having right hip arthritic and pinch nerve pain. May take pain pill if needed. Return in one month.

## 2015-06-10 ENCOUNTER — Encounter: Payer: Medicaid Other | Admitting: Podiatry

## 2015-07-08 ENCOUNTER — Encounter: Payer: Medicaid Other | Admitting: Podiatry

## 2015-07-15 ENCOUNTER — Ambulatory Visit (INDEPENDENT_AMBULATORY_CARE_PROVIDER_SITE_OTHER): Payer: Medicaid Other | Admitting: Podiatry

## 2015-07-15 ENCOUNTER — Encounter: Payer: Self-pay | Admitting: Podiatry

## 2015-07-15 VITALS — BP 124/78 | HR 58

## 2015-07-15 DIAGNOSIS — Z9889 Other specified postprocedural states: Secondary | ICD-10-CM

## 2015-07-15 NOTE — Progress Notes (Signed)
Subjective:  2 months post op [place] bunionectomy right. (04/09/15) 65 year old female presents aided by a walker stating that her foot is doing better. Still has some swelling on right foot.  Objective: On right foot, S/P 3 month [place] bunionectomy. Incision site healed well.  Reduced swelling with good range of motion.  Assessment: Normal post op wound with mild forefoot edema.  Plan: Compression bandage placed with instruction.  Will see her as needed.

## 2015-07-15 NOTE — Patient Instructions (Signed)
Follow up on right foot bunion surgery. Improving with swelling. Use compression socks as needed.

## 2015-08-10 ENCOUNTER — Telehealth: Payer: Self-pay | Admitting: *Deleted

## 2015-08-10 MED ORDER — HYDROCODONE-ACETAMINOPHEN 10-325 MG PO TABS: 1.0000 | ORAL_TABLET | Freq: Three times a day (TID) | ORAL | Status: DC | PRN

## 2015-08-10 NOTE — Telephone Encounter (Signed)
08/10/2015 Pt called this afternoon and ask can she get a refill on her pain medication.

## 2015-09-22 IMAGING — CR DG KNEE COMPLETE 4+V*R*
4 series · 4 of 4 positions shown · non-contrast
Comparison: 12/23/2010

CLINICAL DATA: Acute right knee pain, swelling and popping
sensation

EXAM:
RIGHT KNEE - COMPLETE 4+ VIEW

[t knee ap right]
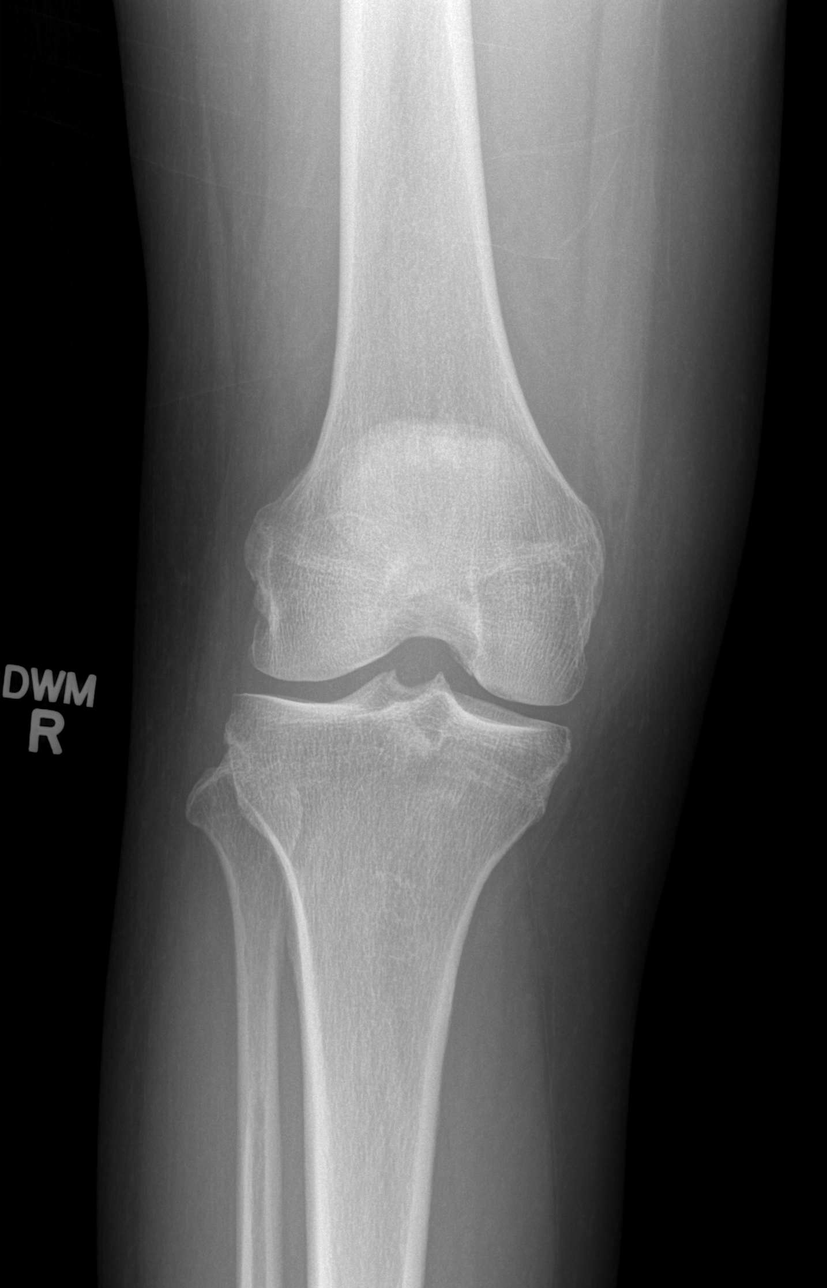

[t knee oblique right (1 of 2)]
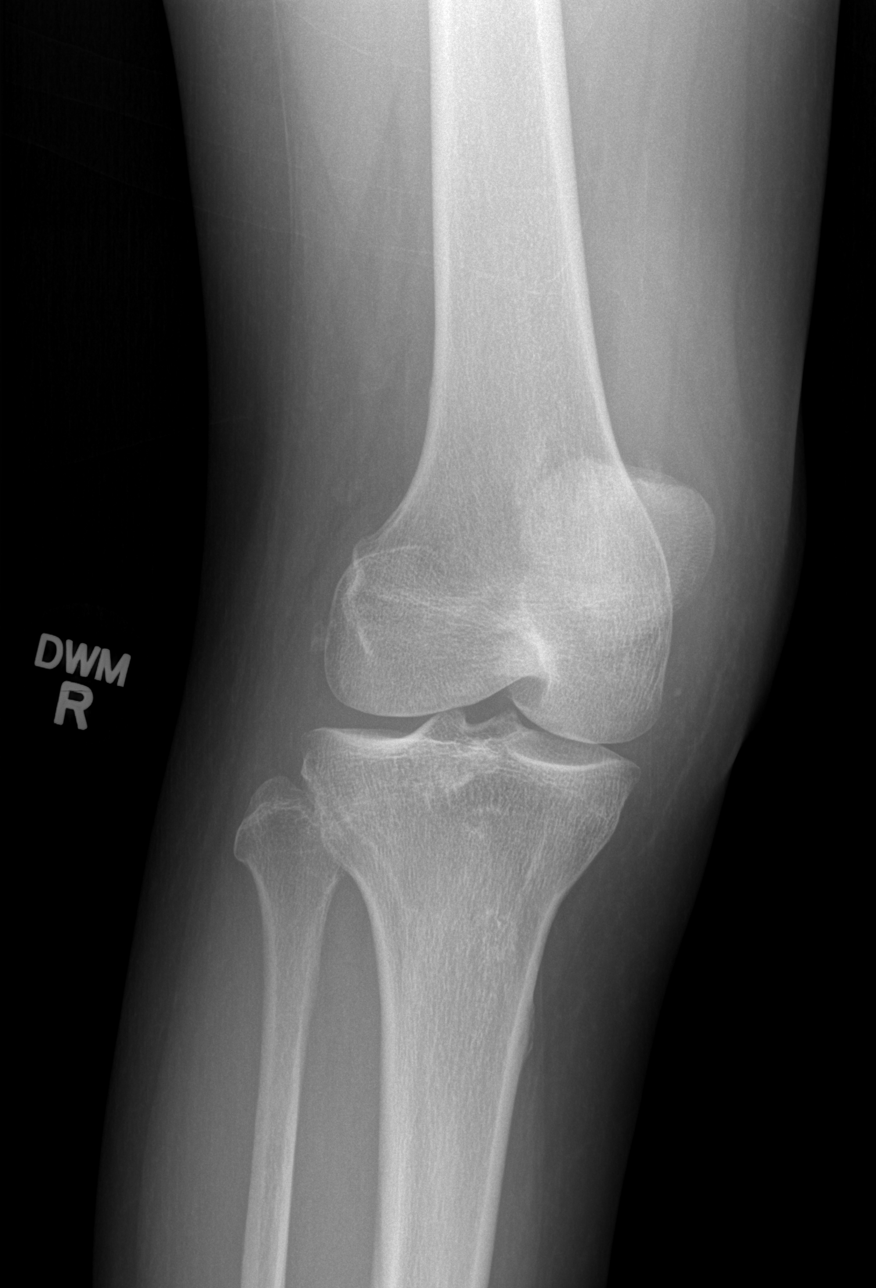

[t knee oblique right (2 of 2)]
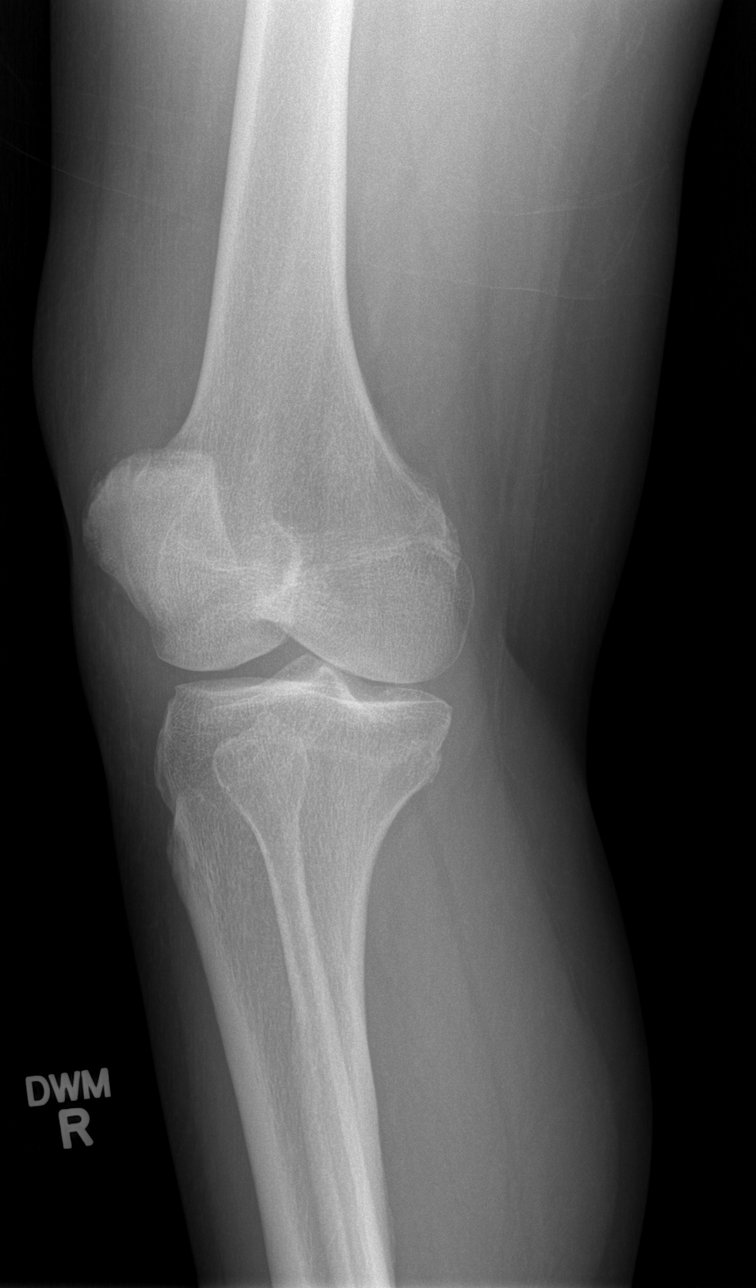

[t knee lat right]
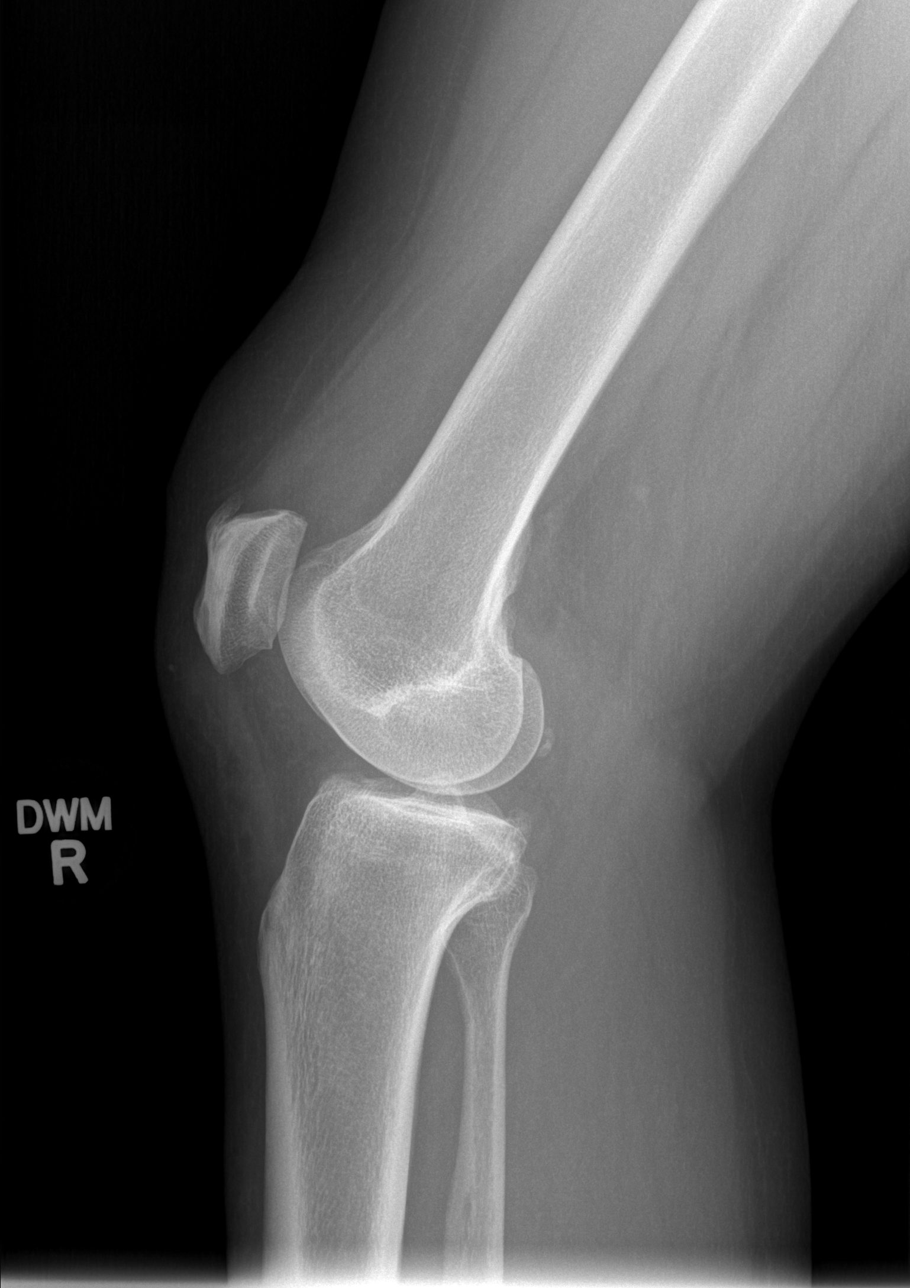

[4 of 4 positions shown; findings below may reference images not displayed]

FINDINGS: Normal alignment without acute fracture. Preserved joint spaces. No
significant arthropathy. Question small effusion on the lateral view
when compared to the prior study.
IMPRESSION: Possible small effusion.  No acute osseous finding

## 2015-10-04 ENCOUNTER — Encounter: Payer: Self-pay | Admitting: Podiatry

## 2015-10-04 ENCOUNTER — Ambulatory Visit (INDEPENDENT_AMBULATORY_CARE_PROVIDER_SITE_OTHER): Payer: Medicaid Other | Admitting: Podiatry

## 2015-10-04 VITALS — BP 150/83 | HR 69

## 2015-10-04 DIAGNOSIS — M79671 Pain in right foot: Secondary | ICD-10-CM

## 2015-10-04 DIAGNOSIS — M7741 Metatarsalgia, right foot: Secondary | ICD-10-CM | POA: Insufficient documentation

## 2015-10-04 DIAGNOSIS — M216X9 Other acquired deformities of unspecified foot: Secondary | ICD-10-CM | POA: Diagnosis not present

## 2015-10-04 DIAGNOSIS — M79606 Pain in leg, unspecified: Secondary | ICD-10-CM

## 2015-10-04 MED ORDER — HYDROCODONE-ACETAMINOPHEN 10-325 MG PO TABS: 1.0000 | ORAL_TABLET | Freq: Three times a day (TID) | ORAL | 0 refills | Status: DC | PRN

## 2015-10-04 NOTE — Progress Notes (Signed)
Subjective: Patient came in requesting prescription for pain.  This is 4 months post op following right foot [place] bunionectomy. Pain is at the ball of right foot. Using pad, Lidocaine patch for this pain. Objective: Pain at lesser MPJ right. No edema or erythema noted. Normal joint motion. Assessment: Lesser metatarsalgia right foot. Plan: Advised to use Orthotics OTC or Custom. Pain medication renewed.

## 2015-10-04 NOTE — Patient Instructions (Signed)
Seen for right foot pain at lesser metatarsal bones. May benefit from [employer] or OTC. Pain medication renewed.  Return as needed.

## 2015-12-10 ENCOUNTER — Ambulatory Visit (INDEPENDENT_AMBULATORY_CARE_PROVIDER_SITE_OTHER): Payer: Medicaid Other | Admitting: Podiatry

## 2015-12-10 ENCOUNTER — Encounter: Payer: Self-pay | Admitting: Podiatry

## 2015-12-10 DIAGNOSIS — M216X9 Other acquired deformities of unspecified foot: Secondary | ICD-10-CM | POA: Diagnosis not present

## 2015-12-10 DIAGNOSIS — M7741 Metatarsalgia, right foot: Secondary | ICD-10-CM

## 2015-12-10 DIAGNOSIS — M79673 Pain in unspecified foot: Secondary | ICD-10-CM

## 2015-12-10 DIAGNOSIS — R600 Localized edema: Secondary | ICD-10-CM

## 2015-12-10 DIAGNOSIS — R609 Edema, unspecified: Secondary | ICD-10-CM

## 2015-12-10 DIAGNOSIS — M21969 Unspecified acquired deformity of unspecified lower leg: Secondary | ICD-10-CM

## 2015-12-10 MED ORDER — HYDROCODONE-ACETAMINOPHEN 10-325 MG PO TABS: 1.0000 | ORAL_TABLET | Freq: Three times a day (TID) | ORAL | 0 refills | Status: AC | PRN

## 2015-12-10 NOTE — Progress Notes (Signed)
Subjective: 65 year old female presents complaining of aching, stinging 2nd toe right foot duration of 2-3 weeks.  Pain is at level 8-9 with 10 being highest.  She soaked last night and placed some ointment pad (Lidocane patch). She got the patch from her friend.  Pain is under bottom of ball, centered 2nd MPJ, and tenderness over scarred bunion incision area of right foot. If she takes one Hydrocodone in the morning it helps all through out till night.  Patient requests prescription for pain.   HPI: 8 months post op following right foot [place] bunionectomy (04/09/15). Pain is at the ball of right foot. Using pad, Lidocaine patch for this pain.  Objective: Dermatologic: Positive of swollen lesser MPJ of right foot with pain upon loading. Scar from Bunion surgery over the first MPJ right with tenderness upon ambulation.  Vascular: All pedal pulses are palpable bilateral. Mild forefoot edema right foot. Neurologic: On going pain on right forefoot. Occasional pain on left foot.  Orthopedic: Reduced bunion deformity right following [place] bunionectomy. Mild HAV with bunion on left. Elevated first ray bilateral. 1st MPJ has normal range of motion bilateral.  Assessment:  Lesser metatarsalgia right foot. Forefoot edema right. Lateral column weight shifting from elevated first ray bilateral. Hypermobile first ray bilateral. S/P [place] bunionectomy with residual pain upon ambulation.  Plan: Reviewed findings and available treatment options including surgical, Cotton osteotomy with bone graft to balance forefoot weight bearing.  She is still working on Scientist, product/process development. Pain medication prescribed.

## 2015-12-10 NOTE — Patient Instructions (Signed)
Seen for pain in right foot. Noted of swelling on lesser metatarsal joint area. This is from lateral weight shifting.  Pain medication prescribed. Orthotics would help. Also has surgical option, balancing weight bearing point of forefoot on right.  Return as needed.

## 2021-07-10 ENCOUNTER — Emergency Department (HOSPITAL_BASED_OUTPATIENT_CLINIC_OR_DEPARTMENT_OTHER)
Admission: EM | Admit: 2021-07-10 | Discharge: 2021-07-10 | Disposition: A | Discharge status: Home / Self Care | Payer: Medicare Other | Attending: Emergency Medicine | Admitting: Emergency Medicine

## 2021-07-10 ENCOUNTER — Encounter (HOSPITAL_BASED_OUTPATIENT_CLINIC_OR_DEPARTMENT_OTHER): Payer: Self-pay | Admitting: Emergency Medicine

## 2021-07-10 ENCOUNTER — Other Ambulatory Visit: Payer: Self-pay

## 2021-07-10 DIAGNOSIS — H1031 Unspecified acute conjunctivitis, right eye: Secondary | ICD-10-CM | POA: Insufficient documentation

## 2021-07-10 DIAGNOSIS — Z7984 Long term (current) use of oral hypoglycemic drugs: Secondary | ICD-10-CM | POA: Insufficient documentation

## 2021-07-10 DIAGNOSIS — H5789 Other specified disorders of eye and adnexa: Secondary | ICD-10-CM | POA: Diagnosis present

## 2021-07-10 MED ORDER — TOBRAMYCIN 0.3 % OP SOLN
1.0000 [drp] | Freq: Once | OPHTHALMIC | Status: AC
  Administered 2021-07-10: 1 [drp] via OPHTHALMIC
  Filled 2021-07-10: qty 5

## 2021-07-10 MED ORDER — TETRACAINE HCL 0.5 % OP SOLN
2.0000 [drp] | Freq: Once | OPHTHALMIC | Status: AC
  Administered 2021-07-10: 2 [drp] via OPHTHALMIC
  Filled 2021-07-10: qty 4

## 2021-07-10 MED ORDER — AMOXICILLIN-POT CLAVULANATE 875-125 MG PO TABS
1.0000 | ORAL_TABLET | Freq: Once | ORAL | Status: AC
  Administered 2021-07-10: 1 via ORAL
  Filled 2021-07-10: qty 1

## 2021-07-10 MED ORDER — FLUORESCEIN SODIUM 1 MG OP STRP
1.0000 | ORAL_STRIP | Freq: Once | OPHTHALMIC | Status: AC
  Administered 2021-07-10: 1 via OPHTHALMIC
  Filled 2021-07-10: qty 1

## 2021-07-10 MED ORDER — AMOXICILLIN-POT CLAVULANATE 875-125 MG PO TABS: 1.0000 | ORAL_TABLET | Freq: Two times a day (BID) | ORAL | 0 refills | Status: AC

## 2021-07-10 NOTE — Discharge Instructions (Signed)
Please read and follow all provided instructions. ? ?Your diagnoses today include:  ?1. Acute bacterial conjunctivitis of right eye   ? ? ?Tests performed today include: ?Visual acuity testing to check your vision ?Fluorescein dye examination to look for scratches on your eye ?Tonometry to check the pressure inside of your eye ?Vital signs. See below for your results today.  ? ?Medications prescribed:  ?Tobrex (tobramycin) - antibiotic eye drops or eye ointment ? ?Use this medication as follows: ?Use 1-2 drops in affected eye every 4 hours while awake for 5 days. ? ?Augmentin - antibiotic ? ?You have been prescribed an antibiotic medicine: take the entire course of medicine even if you are feeling better. Stopping early can cause the antibiotic not to work. ? ?Take any prescribed medications only as directed. ? ?Home care instructions:  ?Follow any educational materials contained in this packet. If you wear contact lenses, do not use them until your eye caregiver approves. Follow-up care is necessary to be sure the infection is healing if not completely resolved in 2-3 days. See your caregiver or eye specialist as suggested for followup.  ? ?If you have an eye infection, wash your hands often as this is very contagious and is easily spread from person to person.  ? ?Follow-up instructions: ?Please follow-up with your primary care doctor OR the opthalmologist listed in the next 2-3 days for further evaluation of your symptoms. ? ?Return instructions:  ?Please return to the Emergency Department if you experience worsening symptoms.  ?Please return immediately if you develop severe pain, pus drainage, new change in vision, or fever. ?Please return if you have any other emergent concerns. ? ?Additional Information: ? ?Your vital signs today were: ?BP 124/75 (BP Location: Left Arm)   Pulse 74   Temp 98.8 ?F (37.1 ?C) (Oral)   Resp 16   Ht '5\' 3"'$  (1.6 m)   Wt 63.5 kg   SpO2 97%   BMI 24.80 kg/m?  ?If your blood  pressure (BP) was elevated above 135/85 this visit, please have this repeated by your doctor within one month. ?--------------- ? ?

## 2021-07-10 NOTE — ED Provider Notes (Signed)
?[facility] ?Provider Note ? ? ?CSN: [id number] ?Arrival date & time: 07/10/21  1429 ? ?  ? ?History ? ?Chief Complaint  ?Patient presents with  ? Conjunctivitis  ? ? ?Jessica Walker is a 71 y.o. female. ? ?Patient presents to the emergency department for evaluation of red right eye.  Symptoms started approximately 48 hours ago.  Patient states that she an appointment with her gastroenterologist at that time, who noticed that her eye was red, and wrote her prescription for Polytrim drops.  She has been using these over the past couple days without improvement.  She has noted some crusting and mild drainage from the eye.  No significant pain, headache, or temporal pain.  No vision changes.  She has had some swelling around the eye and has applied ice.  No fevers.  No history of eye surgeries or contact lens use recently.  No other URI symptoms.  She denies trauma to the head or face and denies any foreign bodies that have gone into the eye. ? ? ?  ? ?Home Medications ?Prior to Admission medications   ?Medication Sig Start Date End Date Taking? Authorizing Provider  ?amoxicillin-clavulanate (AUGMENTIN) 875-125 MG tablet Take 1 tablet by mouth every 12 (twelve) hours. 07/10/21  Yes Carlisle Cater, PA-C  ?atenolol (TENORMIN) 50 MG tablet Frequency:   Dosage:0   MG  Instructions:  Note:TAKE ONE TABLET BY MOUTH TWICE DAILY 09/20/10   [provider]  ?Calcium Carbonate-Vitamin D (CALCIUM + D PO) Take by mouth.    [provider]  ?cetirizine (ZYRTEC) 10 MG tablet Take 10 mg by mouth daily.    [provider]  ?cloNIDine-chlorthalidone (CLORPRES) 0.1-15 MG tablet Take by mouth.    [provider]  ?cyclobenzaprine (FLEXERIL) 10 MG tablet Take 1 tablet (10 mg total) by mouth 2 (two) times daily as needed for muscle spasms. 07/05/14   Blanchie Dessert, MD  ?gabapentin (NEURONTIN) 300 MG capsule Frequency:   Dosage:0   MG  Instructions:  Note:TAKE ONE CAPSULE BY  MOUTH FOUR TIMES DAILY 01/25/10   [provider]  ?hydrochlorothiazide (HYDRODIURIL) 25 MG tablet Frequency:   Dosage:0   MG  Instructions:  Note:take 1/2 tablet daily 09/20/10   [provider]  ?HYDROcodone-acetaminophen (NORCO) 10-325 MG tablet Take 1 tablet by mouth every 8 (eight) hours as needed. 12/10/15   Sheard, Myeong O, DPM  ?meloxicam (MOBIC) 7.5 MG tablet Take 7.5 mg by mouth daily.    [provider]  ?metFORMIN (GLUCOPHAGE) 500 MG tablet Take 500 mg by mouth 2 (two) times daily with a meal.    [provider]  ?Multiple Vitamin (MULTI-DAY VITAMINS) TABS Frequency:   Dosage:0     Instructions:  Note:take 1 tab daily 03/10/08   [provider]  ?omeprazole (PRILOSEC) 40 MG capsule Take 40 mg by mouth daily.    [provider]  ?sertraline (ZOLOFT) 50 MG tablet Frequency:   Dosage:0   MG  Instructions:  Note:TAKE ONE TABLET BY MOUTH ONCE DAILY 10/22/10   [provider]  ?simvastatin (ZOCOR) 20 MG tablet Take 20 mg by mouth daily.    [provider]  ?vitamin E 200 UNIT capsule Frequency:   Dosage:0   UNIT  Instructions:  Note:2 cap. every morning 04/15/08   [provider]  ?   ? ?Allergies    ?Aspirin, Eggs or egg-derived products, and Propoxyphene   ? ?Review of Systems   ?Review of Systems ? ?Physical Exam ?  Updated Vital Signs ?BP (!) 159/92 (BP Location: Left Arm)   Pulse 65   Temp 98.8 ?F (37.1 ?C) (Oral)   Resp 18   Ht '5\' 3"'$  (1.6 m)   Wt 63.5 kg   SpO2 96%   BMI 24.80 kg/m?  ? ?Physical Exam ?Vitals and nursing note reviewed.  ?Constitutional:   ?   Appearance: She is well-developed.  ?HENT:  ?   Head: Normocephalic and atraumatic.  ?Eyes:  ?   Intraocular pressure: Right eye pressure is 17 mmHg. Measurements were taken using a handheld tonometer. ?   Extraocular Movements: Extraocular movements intact.  ?   Conjunctiva/sclera:  ?   Right eye: Right conjunctiva is injected. Chemosis present. No exudate or  hemorrhage. ?   Left eye: Left conjunctiva is not injected. No chemosis, exudate or hemorrhage. ?   Pupils: Pupils are equal, round, and reactive to light. Pupils are equal.  ?   Right eye: Fluorescein uptake present.  ?   Left eye: No fluorescein uptake.  ?   Comments: Mild periorbital edema with minimal erythema on the right when compared to the left.  Small area of fluorescein uptake right cornea at approximately 9 PM position.  ?Pulmonary:  ?   Effort: No respiratory distress.  ?Musculoskeletal:  ?   Cervical back: Normal range of motion and neck supple.  ?Skin: ?   General: Skin is warm and dry.  ?Neurological:  ?   Mental Status: She is alert.  ? ? ?ED Results / Procedures / Treatments   ?Labs ?(all labs ordered are listed, but only abnormal results are displayed) ?Labs Reviewed - No data to display ? ?EKG ?None ? ?Radiology ?No results found. ? ?Procedures ?Procedures  ? ? ?Medications Ordered in ED ?Medications  ?fluorescein ophthalmic strip 1 strip (1 strip Right Eye Given 07/10/21 1530)  ?tetracaine (PONTOCAINE) 0.5 % ophthalmic solution 2 drop (2 drops Right Eye Given 07/10/21 1530)  ?tobramycin (TOBREX) 0.3 % ophthalmic solution 1 drop (1 drop Left Eye Given 07/10/21 1616)  ?amoxicillin-clavulanate (AUGMENTIN) 875-125 MG per tablet 1 tablet (1 tablet Oral Given 07/10/21 1615)  ? ? ?ED Course/ Medical Decision Making/ A&P ?  ? ?Patient seen and examined. History obtained directly from patient.  ? ?Labs/EKG: None ordered ? ?Imaging: None ordered ? ?Medications/Fluids: Tetracaine, fluorescein. ? ?Most recent vital signs reviewed and are as follows: ?BP (!) 159/92 (BP Location: Left Arm)   Pulse 65   Temp 98.8 ?F (37.1 ?C) (Oral)   Resp 18   Ht '5\' 3"'$  (1.6 m)   Wt 63.5 kg   SpO2 96%   BMI 24.80 kg/m?  ? ?Initial impression: Right eye conjunctivitis ? ?Home treatment plan: We will broaden antibiotic coverage and change from Polytrim to Tobrex drops as well as oral Augmentin due to the mild periorbital  edema.  Cannot rule out mild periorbital cellulitis. ? ?Return instructions discussed with patient: Vision changes, worsening swelling or pain, fever  ? ?Follow-up instructions discussed with patient: encouraged patient to follow-up with her primary care doctor or the ophthalmology referral given in the next 48 hours. ? ? ?                        ?Medical Decision Making ?Risk ?Prescription drug management. ? ? ?Patient with nonpainful red right eye.  No foreign bodies noted or suspected.  Mild surrounding erythema, swelling --possible early periorbital cellulitis but no vision changes/loss suspicious for orbital cellulitis. No signs  of iritis. No signs of glaucoma, intraocular pressure normal. No symptoms of retinal detachment. No ophthalmologic emergency suspected. Outpatient referral given in case of no improvement.  ? ? ? ? ? ? ? ? ?Final Clinical Impression(s) / ED Diagnoses ?Final diagnoses:  ?Acute bacterial conjunctivitis of right eye  ? ? ?Rx / DC Orders ?ED Discharge Orders   ? ?      Ordered  ?  amoxicillin-clavulanate (AUGMENTIN) 875-125 MG tablet  Every 12 hours       ? 07/10/21 1626  ? ?  ?  ? ?  ? ? ?  ?Carlisle Cater, PA-C ?07/10/21 1642 ? ?  ?Tegeler, Gwenyth Allegra, MD ?07/11/21 0020 ? ?

## 2021-07-10 NOTE — ED Triage Notes (Signed)
Pt c/o crust covered right eye x couple days. Pt was started on Polymyxin B and Trimethoprim eye drops a few days before symptoms started. Pt presents with redness and swelling to right eye. ?

## 2022-07-08 ENCOUNTER — Other Ambulatory Visit: Payer: Self-pay

## 2022-07-08 ENCOUNTER — Encounter (HOSPITAL_BASED_OUTPATIENT_CLINIC_OR_DEPARTMENT_OTHER): Payer: Self-pay | Admitting: Emergency Medicine

## 2022-07-08 ENCOUNTER — Emergency Department (HOSPITAL_BASED_OUTPATIENT_CLINIC_OR_DEPARTMENT_OTHER): Payer: 59

## 2022-07-08 ENCOUNTER — Emergency Department (HOSPITAL_BASED_OUTPATIENT_CLINIC_OR_DEPARTMENT_OTHER)
Admission: EM | Admit: 2022-07-08 | Discharge: 2022-07-08 | Disposition: A | Discharge status: Home / Self Care | Payer: 59 | Attending: Emergency Medicine | Admitting: Emergency Medicine

## 2022-07-08 DIAGNOSIS — S20212A Contusion of left front wall of thorax, initial encounter: Secondary | ICD-10-CM | POA: Diagnosis not present

## 2022-07-08 DIAGNOSIS — I10 Essential (primary) hypertension: Secondary | ICD-10-CM | POA: Insufficient documentation

## 2022-07-08 DIAGNOSIS — Z87891 Personal history of nicotine dependence: Secondary | ICD-10-CM | POA: Diagnosis not present

## 2022-07-08 MED ORDER — OXYCODONE-ACETAMINOPHEN 5-325 MG PO TABS
1.0000 | ORAL_TABLET | Freq: Once | ORAL | Status: AC
  Administered 2022-07-08: 1 via ORAL
  Filled 2022-07-08: qty 1
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
